# Patient Record
Sex: Female | Born: 1960 | Race: White | Hispanic: No | Marital: Married | Smoking: Never smoker
Health system: Southern US, Community
[De-identification: ages and names within clinical notes are randomized; demographics above are authoritative.]

## PROBLEM LIST (undated history)

## (undated) DIAGNOSIS — M810 Age-related osteoporosis without current pathological fracture: Secondary | ICD-10-CM

## (undated) DIAGNOSIS — R000 Tachycardia, unspecified: Secondary | ICD-10-CM

## (undated) DIAGNOSIS — N189 Chronic kidney disease, unspecified: Secondary | ICD-10-CM

## (undated) HISTORY — PX: AUGMENTATION MAMMAPLASTY: SUR837

## (undated) HISTORY — DX: Chronic kidney disease, unspecified: N18.9

## (undated) HISTORY — PX: LAPAROSCOPY: SHX197

## (undated) HISTORY — DX: Tachycardia, unspecified: R00.0

## (undated) HISTORY — DX: Age-related osteoporosis without current pathological fracture: M81.0

## (undated) HISTORY — PX: NASAL SEPTUM SURGERY: SHX37

## (undated) HISTORY — PX: LAPAROTOMY: SHX154

## (undated) HISTORY — PX: COLONOSCOPY: SHX174

## (undated) HISTORY — PX: LYMPH NODE BIOPSY: SHX201

---

## 1999-09-09 ENCOUNTER — Other Ambulatory Visit: Admission: RE | Admit: 1999-09-09 | Discharge: 1999-09-09 | Payer: Self-pay | Admitting: Obstetrics and Gynecology

## 1999-11-14 ENCOUNTER — Encounter: Payer: Self-pay | Admitting: Obstetrics and Gynecology

## 1999-11-14 ENCOUNTER — Encounter: Admission: RE | Admit: 1999-11-14 | Discharge: 1999-11-14 | Payer: Self-pay | Admitting: Obstetrics and Gynecology

## 2000-03-18 ENCOUNTER — Encounter: Payer: Self-pay | Admitting: Obstetrics and Gynecology

## 2000-03-18 ENCOUNTER — Encounter: Admission: RE | Admit: 2000-03-18 | Discharge: 2000-03-18 | Payer: Self-pay | Admitting: Obstetrics and Gynecology

## 2000-10-02 ENCOUNTER — Other Ambulatory Visit: Admission: RE | Admit: 2000-10-02 | Discharge: 2000-10-02 | Payer: Self-pay | Admitting: Obstetrics and Gynecology

## 2001-03-22 ENCOUNTER — Encounter: Payer: Self-pay | Admitting: Obstetrics and Gynecology

## 2001-03-22 ENCOUNTER — Encounter: Admission: RE | Admit: 2001-03-22 | Discharge: 2001-03-22 | Payer: Self-pay | Admitting: Obstetrics and Gynecology

## 2001-08-24 ENCOUNTER — Other Ambulatory Visit: Admission: RE | Admit: 2001-08-24 | Discharge: 2001-08-24 | Payer: Self-pay | Admitting: Obstetrics and Gynecology

## 2001-09-17 ENCOUNTER — Encounter: Payer: Self-pay | Admitting: Obstetrics and Gynecology

## 2001-09-17 ENCOUNTER — Encounter: Admission: RE | Admit: 2001-09-17 | Discharge: 2001-09-17 | Payer: Self-pay | Admitting: Obstetrics and Gynecology

## 2002-03-22 ENCOUNTER — Encounter: Payer: Self-pay | Admitting: Obstetrics and Gynecology

## 2002-03-22 ENCOUNTER — Encounter: Admission: RE | Admit: 2002-03-22 | Discharge: 2002-03-22 | Payer: Self-pay | Admitting: Obstetrics and Gynecology

## 2002-08-25 ENCOUNTER — Other Ambulatory Visit: Admission: RE | Admit: 2002-08-25 | Discharge: 2002-08-25 | Payer: Self-pay | Admitting: Obstetrics and Gynecology

## 2002-09-26 ENCOUNTER — Encounter: Payer: Self-pay | Admitting: Obstetrics and Gynecology

## 2002-09-26 ENCOUNTER — Encounter: Admission: RE | Admit: 2002-09-26 | Discharge: 2002-09-26 | Payer: Self-pay | Admitting: Obstetrics and Gynecology

## 2003-09-05 ENCOUNTER — Other Ambulatory Visit: Admission: RE | Admit: 2003-09-05 | Discharge: 2003-09-05 | Payer: Self-pay | Admitting: Obstetrics and Gynecology

## 2003-09-21 ENCOUNTER — Encounter: Admission: RE | Admit: 2003-09-21 | Discharge: 2003-09-21 | Payer: Self-pay | Admitting: Obstetrics and Gynecology

## 2003-12-13 ENCOUNTER — Encounter (INDEPENDENT_AMBULATORY_CARE_PROVIDER_SITE_OTHER): Payer: Self-pay | Admitting: Cardiology

## 2003-12-13 ENCOUNTER — Ambulatory Visit (HOSPITAL_COMMUNITY): Admission: RE | Admit: 2003-12-13 | Discharge: 2003-12-13 | Payer: Self-pay | Admitting: Cardiology

## 2004-09-30 ENCOUNTER — Encounter: Admission: RE | Admit: 2004-09-30 | Discharge: 2004-09-30 | Payer: Self-pay | Admitting: Obstetrics and Gynecology

## 2004-10-08 ENCOUNTER — Other Ambulatory Visit: Admission: RE | Admit: 2004-10-08 | Discharge: 2004-10-08 | Payer: Self-pay | Admitting: Obstetrics and Gynecology

## 2004-12-05 ENCOUNTER — Encounter: Admission: RE | Admit: 2004-12-05 | Discharge: 2004-12-05 | Payer: Self-pay | Admitting: Obstetrics and Gynecology

## 2005-10-16 ENCOUNTER — Other Ambulatory Visit: Admission: RE | Admit: 2005-10-16 | Discharge: 2005-10-16 | Payer: Self-pay | Admitting: Obstetrics and Gynecology

## 2005-10-17 ENCOUNTER — Encounter: Admission: RE | Admit: 2005-10-17 | Discharge: 2005-10-17 | Payer: Self-pay | Admitting: Obstetrics and Gynecology

## 2006-10-19 ENCOUNTER — Encounter: Admission: RE | Admit: 2006-10-19 | Discharge: 2006-10-19 | Payer: Self-pay | Admitting: Obstetrics and Gynecology

## 2006-10-20 ENCOUNTER — Other Ambulatory Visit: Admission: RE | Admit: 2006-10-20 | Discharge: 2006-10-20 | Payer: Self-pay | Admitting: Obstetrics and Gynecology

## 2007-10-18 ENCOUNTER — Ambulatory Visit: Payer: Self-pay | Admitting: Internal Medicine

## 2007-10-25 ENCOUNTER — Other Ambulatory Visit: Admission: RE | Admit: 2007-10-25 | Discharge: 2007-10-25 | Payer: Self-pay | Admitting: Obstetrics and Gynecology

## 2007-11-01 ENCOUNTER — Encounter: Admission: RE | Admit: 2007-11-01 | Discharge: 2007-11-01 | Payer: Self-pay | Admitting: Obstetrics and Gynecology

## 2007-11-18 ENCOUNTER — Ambulatory Visit: Payer: Self-pay | Admitting: Internal Medicine

## 2007-11-29 ENCOUNTER — Encounter: Admission: RE | Admit: 2007-11-29 | Discharge: 2007-11-29 | Payer: Self-pay | Admitting: Obstetrics and Gynecology

## 2008-10-13 HISTORY — PX: BREAST ENHANCEMENT SURGERY: SHX7

## 2008-10-30 ENCOUNTER — Other Ambulatory Visit: Admission: RE | Admit: 2008-10-30 | Discharge: 2008-10-30 | Payer: Self-pay | Admitting: Obstetrics and Gynecology

## 2008-11-06 ENCOUNTER — Encounter: Admission: RE | Admit: 2008-11-06 | Discharge: 2008-11-06 | Payer: Self-pay | Admitting: Obstetrics and Gynecology

## 2009-10-13 HISTORY — PX: BLEPHAROPLASTY: SUR158

## 2009-11-05 ENCOUNTER — Other Ambulatory Visit: Admission: RE | Admit: 2009-11-05 | Discharge: 2009-11-05 | Payer: Self-pay | Admitting: Obstetrics and Gynecology

## 2009-12-11 ENCOUNTER — Encounter: Admission: RE | Admit: 2009-12-11 | Discharge: 2009-12-11 | Payer: Self-pay | Admitting: Obstetrics and Gynecology

## 2010-11-06 ENCOUNTER — Other Ambulatory Visit: Payer: Self-pay | Admitting: Obstetrics & Gynecology

## 2010-11-06 DIAGNOSIS — Z1239 Encounter for other screening for malignant neoplasm of breast: Secondary | ICD-10-CM

## 2010-11-19 ENCOUNTER — Other Ambulatory Visit: Payer: Self-pay | Admitting: Obstetrics & Gynecology

## 2010-11-19 ENCOUNTER — Encounter: Payer: 59 | Admitting: Obstetrics & Gynecology

## 2010-11-19 DIAGNOSIS — Z1272 Encounter for screening for malignant neoplasm of vagina: Secondary | ICD-10-CM

## 2010-11-19 DIAGNOSIS — Z01419 Encounter for gynecological examination (general) (routine) without abnormal findings: Secondary | ICD-10-CM

## 2010-12-17 ENCOUNTER — Ambulatory Visit
Admission: RE | Admit: 2010-12-17 | Discharge: 2010-12-17 | Disposition: A | Discharge status: Home / Self Care | Payer: 59 | Source: Ambulatory Visit | Attending: Obstetrics & Gynecology | Admitting: Obstetrics & Gynecology

## 2010-12-17 DIAGNOSIS — Z1239 Encounter for other screening for malignant neoplasm of breast: Secondary | ICD-10-CM

## 2010-12-24 ENCOUNTER — Other Ambulatory Visit: Payer: Self-pay | Admitting: Obstetrics & Gynecology

## 2010-12-24 DIAGNOSIS — R928 Other abnormal and inconclusive findings on diagnostic imaging of breast: Secondary | ICD-10-CM

## 2010-12-30 ENCOUNTER — Ambulatory Visit
Admission: RE | Admit: 2010-12-30 | Discharge: 2010-12-30 | Disposition: A | Discharge status: Home / Self Care | Payer: 59 | Source: Ambulatory Visit | Attending: Obstetrics & Gynecology | Admitting: Obstetrics & Gynecology

## 2010-12-30 DIAGNOSIS — R928 Other abnormal and inconclusive findings on diagnostic imaging of breast: Secondary | ICD-10-CM

## 2011-01-03 NOTE — Assessment & Plan Note (Signed)
NAMEAMABEL, STMARIE             ACCOUNT NO.:  [id number]  MEDICAL RECORD NO.:  [id number]          PATIENT TYPE:  LOCATION:  [facility] [place]           FACILITY:  PHYSICIAN:  Allie Bossier, MD             DATE OF BIRTH:  DATE OF SERVICE:  11/19/2010                                 CLINIC NOTE  Ms. Wirtz is a 50 year old married white, G2, P1, A1.  She has an 36- year-old daughter who is a patient here ([place]).  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 She has discovered a perineal "mass" that has been there since Christmas and she wants that evaluated.  She is also due for her Pap smear.  PAST MEDICAL HISTORY:  She had a history of primary infertility and was treated with drugs, but actually she had the pregnancy on her own.  She was told she had endometriosis in the past.  She had a history of osteopenia, but her most recent DEXA scan that is normal and she has sinus tachycardia.  MEDICATIONS:  Calcium, vitamin D, multivitamin, B12, Toprol XL 25 mg daily, Loestrin 124 and fish oil b.i.d.  PAST SURGICAL HISTORY:  She had a right ruptured ectopic that required a laparotomy, wisdom teeth extraction, laparoscopy and laparotomy x2, saline breast implants in 2009.  ALLERGIES:  No latex allergies.  Drug allergy is CODEINE.  REVIEW OF SYSTEMS:  She has been married for 26 years.  She denies dyspareunia.  Pap smear is due.  Her mammogram is scheduled for December 12, 2010.  She has had 2 colonoscopies and the last was done in 2009.  Her last bone density was 2 years ago.  Please note, on a colonoscopy they did find some polyps the first time, but not the second time.  FAMILY HISTORY:  Negative for breast, GYN and colon cancers, but positive for heart disease in her father.  PHYSICAL EXAMINATION:  GENERAL:  Well-nourished, well-hydrated pleasant white female, height 5 feet 8-3/4, weight 132 pounds, blood pressure 128/79, pulse  90. HEENT:  Normal. HEART:  Regular rate and rhythm. LUNGS:  Clear to auscultation bilaterally. BREASTS:  Normal after saline implants.  No nipple discharge, skin changes or appreciable masses. ABDOMEN:  Scaphoid.  No palpable hepatosplenomegaly. EXTERNAL GENITALIA:  No lesions.  On her left buttock towards the perineum, she has a boil with about a 2-cm tracking.  When I pressed on it, it spilled out a large amount of material and immediately shrunken size.  Her vulva shows no lesions.  Her vagina appears normal.  Cervix appears normal.  Uterus is normal size and shape, anteverted mobile. Adnexa nontender and no masses.  ASSESSMENT AND PLAN:  Annual exam.  I have checked a Pap smear and recommended self-breast and self-vulvar exams with regard to her boil on her bottom.  I have told her that if this does not resolve entirely to come back.  With regard to her birth control, I have changed her to low Loestrin one a day and I have given her samples and prescription.     Allie Bossier, MD  MCD/MEDQ  D:  11/19/2010  T:  11/20/2010  Job:  [id number]

## 2011-02-25 NOTE — Assessment & Plan Note (Signed)
[facility]                         GASTROENTEROLOGY OFFICE NOTE   LAGUANA, DESAUTEL                    MRN:          [id number]  DATE:10/18/2007                            DOB:          [birth year]    The patient is self-referred.   REASON FOR CONSULTATION:  Surveillance colonoscopy.   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  The  patient initially underwent diagnostic colonoscopy in December 2003 with  Dr. Sharrell Ku to evaluate asymptomatic Hemoccult-positive stool.  Complete colonoscopy to the cecum revealed a 2-mm rectal polyp which was  removed and found to be a tubular adenoma.  Also present were internal  and external hemorrhoids, no other abnormalities.  Followup in 5 years  recommended.  The patient has had no interval medical problems or active  GI symptoms.  She denies abdominal pain, bleeding or change in bowel  habits.   PAST MEDICAL HISTORY:  1. Osteoporosis.  2. Cardiac arrhythmia.  3. Status post tubal ligation.  4. Repair of deviated septum.   ALLERGIES:  NO KNOWN DRUG ALLERGIES.      The patient is INTOLERANT TO  CODEINE, which causes nausea.   CURRENT MEDICATIONS:  1. Calcium with vitamin D.  2. Multivitamin.  3. Toprol-XL 50 mg daily.  4. Loestrin 1.5/30 mg daily.  5. Fosamax 70 mg weekly.  6. Advil p.r.n.   FAMILY HISTORY:  No family history of gastrointestinal malignancy.  Father with a history of coronary artery disease.   SOCIAL HISTORY:  The patient is married with 1 daughter.  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  She does not smoke and occasionally  drinks wine.   REVIEW OF SYSTEMS:  Per diagnostic evaluation center.   PHYSICAL EXAMINATION:  Well-appearing female in no acute distress.  Her blood pressure is 90/56, heart rate is  76 and regular.  Weight is  131 pounds.  She is 5 feet 8-1/2 inches in height.  HEENT:  Sclerae are anicteric.  Conjunctivae are pink.  Oral mucosa is  intact.  No adenopathy.  LUNGS:  Clear.  HEART:  Regular.  ABDOMEN:  Soft without tenderness, mass or hernia.   IMPRESSION:  This is a 50 year old female with a history of a diminutive  adenomatous colon polyp.  She presents today for routine surveillance  colonoscopy.  She is an appropriate candidate without contraindication.  The followup interval is appropriate.  We discussed the nature of  colonoscopy in detail.  She understood the risks, benefits and  alternatives and was interested in proceeding.     Wilhemina Bonito. Marina Goodell, MD  Electronically Signed    JNP/MedQ  DD: 10/18/2007  DT: 10/19/2007  Job #: [id number]   cc:   Artist Pais, M.D.

## 2011-04-01 ENCOUNTER — Encounter: Payer: Self-pay | Admitting: Family Medicine

## 2011-04-01 ENCOUNTER — Inpatient Hospital Stay (INDEPENDENT_AMBULATORY_CARE_PROVIDER_SITE_OTHER)
Admission: RE | Admit: 2011-04-01 | Discharge: 2011-04-01 | Disposition: A | Discharge status: Home / Self Care | Payer: 59 | Source: Ambulatory Visit | Attending: Family Medicine | Admitting: Family Medicine

## 2011-04-01 DIAGNOSIS — J069 Acute upper respiratory infection, unspecified: Secondary | ICD-10-CM

## 2011-05-26 ENCOUNTER — Other Ambulatory Visit: Payer: Self-pay | Admitting: Obstetrics & Gynecology

## 2011-05-26 DIAGNOSIS — R921 Mammographic calcification found on diagnostic imaging of breast: Secondary | ICD-10-CM

## 2011-06-30 ENCOUNTER — Ambulatory Visit
Admission: RE | Admit: 2011-06-30 | Discharge: 2011-06-30 | Disposition: A | Discharge status: Home / Self Care | Payer: 59 | Source: Ambulatory Visit | Attending: Obstetrics & Gynecology | Admitting: Obstetrics & Gynecology

## 2011-06-30 DIAGNOSIS — R921 Mammographic calcification found on diagnostic imaging of breast: Secondary | ICD-10-CM

## 2011-09-15 NOTE — Progress Notes (Signed)
Summary: COUGH/FEVER,CHILLS Room 3   Vital Signs:  Patient Profile:   50 Years Old Female CC:      Chills, Fever 100.8, Productive cough x 4 days Height:     68.5 inches Weight:      130 pounds O2 Sat:      100 % O2 treatment:    Room Air Temp:     99.2 degrees F oral Pulse rate:   100 / minute Pulse rhythm:   regular Resp:     12 per minute BP sitting:   116 / 73  (left arm) Cuff size:   regular  Vitals Entered By: Emilio Math (April 01, 2011 2:28 PM)                  Current Allergies: ! CODEINEHistory of Present Illness Chief Complaint: Chills, Fever 100.8, Productive cough x 4 days History of Present Illness:  Subjective: Patient complains of URI symptoms that started 4 days ago with chills, myalgias, headache, fever to 100.8 No sore throat + cough for two days worse at night No pleuritic pain No wheezing No nasal congestion No post-nasal drainage No sinus pain/pressure No itchy/red eyes No earache No hemoptysis No SOB + fever/chills 100.8 No nausea No vomiting No abdominal pain No diarrhea No skin rashes + fatigue + myalgias + headache     Current Meds LOESTRIN 1/20 (21) 1-20 MG-MCG TABS (NORETHINDRONE ACET-ETHINYL EST)  TOPROL XL 50 MG XR24H-TAB (METOPROLOL SUCCINATE)  BENZONATATE 200 MG CAPS (BENZONATATE) One by mouth hs as needed cough AZITHROMYCIN 250 MG TABS (AZITHROMYCIN) Two tabs by mouth on day 1, then 1 tab daily on days 2 through 5 (Rx void after 04/09/11)  REVIEW OF SYSTEMS Constitutional Symptoms       Complains of fever and chills.     Denies night sweats, weight loss, weight gain, and fatigue.  Eyes       Denies change in vision, eye pain, eye discharge, glasses, contact lenses, and eye surgery. Ear/Nose/Throat/Mouth       Complains of hoarseness.      Denies hearing loss/aids, change in hearing, ear pain, ear discharge, dizziness, frequent runny nose, frequent nose bleeds, sinus problems, sore throat, and tooth pain or bleeding.    Respiratory       Complains of productive cough.      Denies dry cough, wheezing, shortness of breath, asthma, bronchitis, and emphysema/COPD.  Cardiovascular       Denies murmurs, chest pain, and tires easily with exhertion.    Gastrointestinal       Denies stomach pain, nausea/vomiting, diarrhea, constipation, blood in bowel movements, and indigestion. Genitourniary       Denies painful urination, kidney stones, and loss of urinary control. Neurological       Denies paralysis, seizures, and fainting/blackouts. Musculoskeletal       Denies muscle pain, joint pain, joint stiffness, decreased range of motion, redness, swelling, muscle weakness, and gout.  Skin       Denies bruising, unusual mles/lumps or sores, and hair/skin or nail changes.  Psych       Denies mood changes, temper/anger issues, anxiety/stress, speech problems, depression, and sleep problems.  Past History:  Past Medical History: Inappropriate Sinus Tachycardia  Past Surgical History: Ectopic pregnancy GYN surgery Deviated Septum repair  Family History: Mother, Tachycardia Father, Bypass surgery  Social History: Non smoker ETOH_yes No DRugs Nurse @ [employer]   Objective:  No acute distress  Eyes:  Pupils are equal, round, and reactive  to light and accomodation.  Extraocular movement is intact.  Conjunctivae are not inflamed.  Ears:  Canals normal.  Tympanic membranes normal.   Nose:  Mildly congested turbinates.  No sinus tenderness  Pharynx:  Normal  Neck:  Supple.  Slightly tender shotty posterior nodes are palpated bilaterally.  Lungs:  Clear to auscultation.  Breath sounds are equal.  Heart:  Regular rate and rhythm without murmurs, rubs, or gallops.  Abdomen:  Nontender without masses or hepatosplenomegaly.  Bowel sounds are present.  No CVA or flank tenderness.  Skin:  No rash Assessment New Problems: UPPER RESPIRATORY INFECTION, ACUTE (ICD-465.9)  NO EVIDENCE BACTERIAL INFECTION   TODAY  Plan New Medications/Changes: AZITHROMYCIN 250 MG TABS (AZITHROMYCIN) Two tabs by mouth on day 1, then 1 tab daily on days 2 through 5 (Rx void after 04/09/11)  #6 tabs x 0, 04/01/2011, Donna Christen MD BENZONATATE 200 MG CAPS (BENZONATATE) One by mouth hs as needed cough  #12 x 0, 04/01/2011, Donna Christen MD  New Orders: Pulse Oximetry (single measurment) [08657] New Patient Level III [84696] Planning Comments:   Treat symptomatically for now:  Increase fluid intake, begin expectorant , topical decongestant,  cough suppressant at bedtime.  If fever/chills/sweats persist, or if not improving 5  days begin Z-pack (given Rx to hold).  Followup with PCP if not improving 7 to 10 days.   The patient and/or caregiver has been counseled thoroughly with regard to medications prescribed including dosage, schedule, interactions, rationale for use, and possible side effects and they verbalize understanding.  Diagnoses and expected course of recovery discussed and will return if not improved as expected or if the condition worsens. Patient and/or caregiver verbalized understanding.  Prescriptions: AZITHROMYCIN 250 MG TABS (AZITHROMYCIN) Two tabs by mouth on day 1, then 1 tab daily on days 2 through 5 (Rx void after 04/09/11)  #6 tabs x 0   Entered and Authorized by:   Donna Christen MD   Signed by:   Donna Christen MD on 04/01/2011   Method used:   Print then Give to Patient   RxID:   [id number] BENZONATATE 200 MG CAPS (BENZONATATE) One by mouth hs as needed cough  #12 x 0   Entered and Authorized by:   Donna Christen MD   Signed by:   Donna Christen MD on 04/01/2011   Method used:   Print then Give to Patient   RxID:   [id number]   Patient Instructions: 1)  Take Mucinex  (guaifenesin) twice daily for congestion. 2)  Increase fluid intake, rest. 3)  If increased sinus congestion develops, may use Afrin nasal spray (or generic oxymetazoline) twice daily for about 5 days.  Also  recommend using saline nasal spray several times daily and/or saline nasal irrigation. 4)  Begin Azithromycin if not improving about one week,  or if persistent fever develops. 5)  Followup with family doctor if not improving 10 to 14 days.   Orders Added: 1)  Pulse Oximetry (single measurment) [94760] 2)  New Patient Level III [63875]

## 2011-11-25 ENCOUNTER — Other Ambulatory Visit: Payer: Self-pay | Admitting: Obstetrics & Gynecology

## 2011-11-25 DIAGNOSIS — R921 Mammographic calcification found on diagnostic imaging of breast: Secondary | ICD-10-CM

## 2011-12-02 ENCOUNTER — Encounter: Payer: Self-pay | Admitting: Obstetrics & Gynecology

## 2011-12-02 ENCOUNTER — Ambulatory Visit (INDEPENDENT_AMBULATORY_CARE_PROVIDER_SITE_OTHER): Payer: 59 | Admitting: Obstetrics & Gynecology

## 2011-12-02 VITALS — BP 109/71 | HR 74 | Temp 98.3°F | Resp 16 | Ht 68.5 in | Wt 127.0 lb

## 2011-12-02 DIAGNOSIS — Z113 Encounter for screening for infections with a predominantly sexual mode of transmission: Secondary | ICD-10-CM

## 2011-12-02 DIAGNOSIS — Z0000 Encounter for general adult medical examination without abnormal findings: Secondary | ICD-10-CM

## 2011-12-02 DIAGNOSIS — Z1272 Encounter for screening for malignant neoplasm of vagina: Secondary | ICD-10-CM

## 2011-12-02 DIAGNOSIS — Z01419 Encounter for gynecological examination (general) (routine) without abnormal findings: Secondary | ICD-10-CM

## 2011-12-02 MED ORDER — NORETHIN-ETH ESTRAD-FE BIPHAS 1 MG-10 MCG / 10 MCG PO TABS: 1.0000 | ORAL_TABLET | Freq: Every day | ORAL | Status: DC

## 2011-12-02 NOTE — Progress Notes (Signed)
Subjective:    Natalie David is a 51 y.o. female who presents for an annual exam. The patient has no complaints today.She wants a refill on her Lo Lo estrin. The patient is sexually active. GYN screening history: last pap: was normal. The patient wears seatbelts: yes. The patient participates in regular exercise: yes. Has the patient ever been transfused or tattooed?: not asked. The patient reports that there is not domestic violence in her life.   Menstrual History: OB History    Grav Para Term Preterm Abortions TAB SAB Ect Mult Living   2 1 1  1   1  1       Menarche age: 17 No LMP recorded. Patient is not currently having periods (Reason: Perimenopausal).    The following portions of the patient's history were reviewed and updated as appropriate: allergies, current medications, past family history, past medical history, past social history, past surgical history and problem list.  Review of Systems Pertinent items are noted in HPI. She is a Charity fundraiser at [employer]. She has had her flu shot this year. Her mammogram is scheduled for March 2013. She has been married for 28 years and denies dysparunia.   Objective:    BP 109/71  Pulse 74  Temp(Src) 98.3 F (36.8 C) (Oral)  Resp 16  Ht 5' 8.5" (1.74 m)  Wt 127 lb (57.607 kg)  BMI 19.03 kg/m2  General Appearance:    Alert, cooperative, no distress, appears stated age  Head:    Normocephalic, without obvious abnormality, atraumatic  Eyes:    PERRL, conjunctiva/corneas clear, EOM's intact, fundi    benign, both eyes  Ears:    Normal TM's and external ear canals, both ears  Nose:   Nares normal, septum midline, mucosa normal, no drainage    or sinus tenderness  Throat:   Lips, mucosa, and tongue normal; teeth and gums normal  Neck:   Supple, symmetrical, trachea midline, no adenopathy;    thyroid:  no enlargement/tenderness/nodules; no carotid   bruit or JVD  Back:     Symmetric, no curvature, ROM normal, no CVA tenderness  Lungs:      Clear to auscultation bilaterally, respirations unlabored  Chest Wall:    No tenderness or deformity   Heart:    Regular rate and rhythm, S1 and S2 normal, no murmur, rub   or gallop  Breast Exam:    No tenderness, masses, or nipple abnormality  Abdomen:     Soft, non-tender, bowel sounds active all four quadrants,    no masses, no organomegaly  Genitalia:    Normal female without lesion, discharge or tenderness, NSSA, NT, mobile, no adnexal masses or tenderness     Extremities:   Extremities normal, atraumatic, no cyanosis or edema  Pulses:   2+ and symmetric all extremities  Skin:   Skin color, texture, turgor normal, no rashes or lesions  Lymph nodes:   Cervical, supraclavicular, and axillary nodes normal  Neurologic:   CNII-XII intact, normal strength, sensation and reflexes    throughout  .    Assessment:    Healthy female exam.    Plan:     Mammogram. Pap smear.

## 2011-12-11 ENCOUNTER — Ambulatory Visit (INDEPENDENT_AMBULATORY_CARE_PROVIDER_SITE_OTHER): Payer: 59 | Admitting: Family Medicine

## 2011-12-11 ENCOUNTER — Encounter: Payer: Self-pay | Admitting: Family Medicine

## 2011-12-11 DIAGNOSIS — R739 Hyperglycemia, unspecified: Secondary | ICD-10-CM

## 2011-12-11 DIAGNOSIS — R000 Tachycardia, unspecified: Secondary | ICD-10-CM

## 2011-12-11 DIAGNOSIS — R7309 Other abnormal glucose: Secondary | ICD-10-CM

## 2011-12-11 DIAGNOSIS — R718 Other abnormality of red blood cells: Secondary | ICD-10-CM

## 2011-12-11 LAB — LIPID PANEL
Cholesterol: 173 mg/dL (ref 0–200)
LDL Cholesterol: 80 mg/dL (ref 0–99)
Total CHOL/HDL Ratio: 2.4 Ratio
VLDL: 20 mg/dL (ref 0–40)

## 2011-12-11 LAB — COMPLETE METABOLIC PANEL WITH GFR
ALT: 9 U/L (ref 0–35)
AST: 21 U/L (ref 0–37)
Albumin: 4.5 g/dL (ref 3.5–5.2)
Alkaline Phosphatase: 53 U/L (ref 39–117)
BUN: 16 mg/dL (ref 6–23)
Calcium: 9.5 mg/dL (ref 8.4–10.5)
Potassium: 4.4 mEq/L (ref 3.5–5.3)
Sodium: 139 mEq/L (ref 135–145)
Total Bilirubin: 0.7 mg/dL (ref 0.3–1.2)

## 2011-12-11 LAB — TSH: TSH: 0.948 u[IU]/mL (ref 0.350–4.500)

## 2011-12-11 LAB — CBC WITH DIFFERENTIAL/PLATELET
Basophils Relative: 0 % (ref 0–1)
Eosinophils Absolute: 0.1 10*3/uL (ref 0.0–0.7)
MCV: 103 fL — ABNORMAL HIGH (ref 78.0–100.0)
Monocytes Absolute: 0.3 10*3/uL (ref 0.1–1.0)
Neutro Abs: 3.1 10*3/uL (ref 1.7–7.7)
Neutrophils Relative %: 55 % (ref 43–77)
Platelets: 190 10*3/uL (ref 150–400)
RDW: 12.2 % (ref 11.5–15.5)
WBC: 5.6 10*3/uL (ref 4.0–10.5)

## 2011-12-11 NOTE — Progress Notes (Signed)
  Subjective:    Patient ID: Natalie David, female    DOB: 31-May-1961, 51 y.o.   MRN: [id number]  HPI Patient is here to get established. She reports getting her yearly Pap smear exam and management of her menopausal symptoms mammogram through her GYN. She has had a history of inappropriate tachycardia that she takes Toprol XL 50 half a tablet a day managed by her cardiologist She takes B12 pills because of a elevated MCV on her CBC She had her colonoscopy about 4 years ago to recheck polyps found on an earlier colonoscopy. She is supposed to schedule a repeat colonoscopy in about one year   immunization reviewed and updated She does need to have routine labs done and she has had some episodes of mildly elevated blood sugars before.   Review of Systems  All other systems reviewed and are negative.      BP 92/62  Pulse 76  Ht 5\' 8"  (1.727 m)  Wt 128 lb (58.06 kg)  BMI 19.46 kg/m2 Objective:   Physical Exam  Constitutional: She is oriented to person, place, and time. She appears well-developed and well-nourished.  HENT:  Head: Normocephalic.  Neck: Neck supple.  Cardiovascular: Normal rate, regular rhythm and normal heart sounds.   Pulmonary/Chest: Effort normal and breath sounds normal.  Neurological: She is alert and oriented to person, place, and time.  Skin: Skin is warm and dry.  Psychiatric: She has a normal mood and affect.          Assessment & Plan:  Patient immunizations updated she doesn't need a new immunizations now Discussed about the B12 pills. Explained that oral B12 unless there is extremely high dosage of it is not absorbed. With her MCV remained elevated we can do one of 3 things. #1 ignore the MCV as long as the H&H remained stable #2 Place her on a much higher B12 or initiate injection or nasal B12 #3 have her keep taking the pills realizes more placebo than therapeutic We'll obtain routine lab work Return in one year for followup.

## 2011-12-11 NOTE — Patient Instructions (Signed)
Vitamin B12 and Folate Test This is a test used to help diagnose the cause of anemia or neuropathy (nerve damage), to evaluate nutritional status in some patients, or to monitor effectiveness of treatment for B12 or folate deficiency. These tests measure the concentration of folate and vitamin B12 in the serum (liquid portion of the blood). The amount of folate inside the red blood cell (RBC) may also be measured ; it will normally be at a higher concentration inside the cell than in the serum.  B12 and folate are both part of the B complex of vitamins and come from food. Folate is found in leafy green vegetables, citrus fruits, dry beans and peas, liver, and yeast; while B12 is found in animal products such as red meat, fish, poultry, milk, and eggs. Fortified cereals, breads, and other grain products are now also important dietary sources of both B12 and folate (identified as "folic acid" on nutritional labels), especially for those vegetarians who do not consume any animal products.  Both B12 and folate are necessary for normal RBC formation, tissue and cellular repair, and DNA synthesis. B12 is also important for nerve health, while folate is necessary for cell division such as is seen in a fetus during pregnancy. A deficiency in either B12 or folate can lead to a form of anemia characterized by the production of fewer, but larger, RBC's (called macrocytes). A deficiency in B12 can also result in varying degrees of neuropathy, nerve damage that can cause tingling and numbness in the patient's hands and feet. A deficiency in folate can cause neural tube defects such as spina bifida in a growing fetus.  PREPARATION FOR TEST No fasting is necessary. A blood sample is obtained by inserting a needle into a vein in the arm. NORMAL FINDINGS 160-950 pg/mL or 118-701 pmol/L (SI units) Ranges for normal findings may vary among different laboratories and hospitals. You should always check with your doctor after  having lab work or other tests done to discuss the meaning of your test results and whether your values are considered within normal limits. MEANING OF TEST  Your caregiver will go over the test results with you and discuss the importance and meaning of your results, as well as treatment options and the need for additional tests if necessary. Reference values are dependent on many factors, including patient age, gender, sample population, and testing method. Numeric test results have different meanings in different labs. Your lab report should include the specific reference range for your test.  If there is a decreased concentration of B12 and/or folate, then it is likely there is some degree of deficiency. The test results will indicate the presence of the deficiency, but they do not necessarily reflect the severity of the anemia or neuropathy associated with the deficiency or its underlying cause. There are a variety of causes of B12 and/or folate deficiencies. Most of these are from poor intake, not absorbing the vitamins or losing more vitamins than are taken in. OBTAINING THE TEST RESULTS It is your responsibility to obtain your test results. Ask the lab or department performing the test when and how you will get your results. Document Released: 10/24/2004 Document Revised: 06/11/2011 Document Reviewed: 10/01/2008 [facility] Patient Information 2012 [place], [place].

## 2011-12-19 ENCOUNTER — Ambulatory Visit
Admission: RE | Admit: 2011-12-19 | Discharge: 2011-12-19 | Disposition: A | Discharge status: Home / Self Care | Payer: 59 | Source: Ambulatory Visit | Attending: Obstetrics & Gynecology | Admitting: Obstetrics & Gynecology

## 2011-12-19 DIAGNOSIS — R921 Mammographic calcification found on diagnostic imaging of breast: Secondary | ICD-10-CM

## 2012-04-06 ENCOUNTER — Ambulatory Visit (INDEPENDENT_AMBULATORY_CARE_PROVIDER_SITE_OTHER): Payer: 59 | Admitting: Family Medicine

## 2012-04-06 ENCOUNTER — Encounter: Payer: Self-pay | Admitting: Family Medicine

## 2012-04-06 VITALS — BP 113/73 | HR 91 | Ht 68.5 in | Wt 130.0 lb

## 2012-04-06 DIAGNOSIS — M545 Low back pain, unspecified: Secondary | ICD-10-CM

## 2012-04-06 MED ORDER — CYCLOBENZAPRINE HCL 10 MG PO TABS: 10.0000 mg | ORAL_TABLET | Freq: Three times a day (TID) | ORAL | Status: DC | PRN

## 2012-04-06 MED ORDER — DICLOFENAC SODIUM 75 MG PO TBEC: 75.0000 mg | DELAYED_RELEASE_TABLET | Freq: Two times a day (BID) | ORAL | Status: DC

## 2012-04-06 NOTE — Progress Notes (Signed)
  Patient Name: Natalie David Date of Birth: 04-Aug-1961 Medical Record Number: [id number] Gender: female  PCP: METHENEY,CATHERINE, MD  History of Present Illness:  Natalie David is a 51 y.o. 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 The back pain is localized into the lumbar spine area. They also describe no radiculopathy.  No numbness or tingling. No bowel or bladder incontinence. No focal weakness. Prior interventions: none Physical therapy: No Chiropractic manipulations: No Acupuncture: No Osteopathic manipulation: No  Past Medical History, Surgical History, Family History, Medications, Allergies have been reviewed and updated if relevant.  Review of Systems  GEN: No fevers, chills. Nontoxic. Primarily MSK c/o today. MSK: Detailed in the HPI GI: tolerating PO intake without difficulty Neuro: As above  Otherwise the pertinent positives of the ROS are noted above.    Physical Exam  Filed Vitals:   04/06/12 1546  BP: 113/73  Pulse: 91  Height: 5' 8.5" (1.74 m)  Weight: 130 lb (58.968 kg)    Gen: Well-developed,well-nourished,in no acute distress; alert,appropriate and cooperative throughout examination HEENT: Normocephalic and atraumatic without obvious abnormalities.  Ears, externally no deformities Pulm: Breathing comfortably in no respiratory distress Range of motion at  the waist: Flexion, rotation and lateral bending: full  No echymosis or edema Rises to examination table with no difficulty Gait: minimally antalgic  Inspection/Deformity: No abnormality Paraspinus T:  Mild L5  B Ankle Dorsiflexion (L5,4): 5/5 B Great Toe Dorsiflexion (L5,4): 5/5 Heel Walk (L5): WNL Toe Walk (S1): WNL Rise/Squat (L4): WNL  SENSORY B Medial Foot (L4): WNL B Dorsum (L5): WNL B  Lateral (S1): WNL Light Touch: WNL Pinprick: WNL  REFLEXES Knee (L4): 2+ Ankle (S1): 2+  B SLR, seated: neg B SLR, supine: neg B FABER: neg B Reverse FABER: neg B Greater Troch: NT B Log Roll: neg B Stork: NT B Sciatic Notch: NT   Assessment and Plan:  Back pain  I reviewed with the patient the structures involved and how they related to diagnosis.   Conservative algorithms for acute back pain generally begin with the following: NSAIDS Muscle Relaxants Mild pain medication  If not progressing, these modalities can be helpful in select cases. Chiropractic or Osteopathic Manipulation -- [place] HEP reviewed with her  Start with medications, core rehab, and progress from there following low back pain algorithm. No red flags are present.  She will call if not improving in a few weeks, and we can make PT referral

## 2012-07-19 ENCOUNTER — Telehealth: Payer: Self-pay | Admitting: *Deleted

## 2012-07-19 DIAGNOSIS — R7989 Other specified abnormal findings of blood chemistry: Secondary | ICD-10-CM

## 2012-07-19 NOTE — Telephone Encounter (Signed)
Lab entered

## 2012-07-20 LAB — BASIC METABOLIC PANEL
Calcium: 9.3 mg/dL (ref 8.4–10.5)
Creat: 1.01 mg/dL (ref 0.50–1.10)
Glucose, Bld: 85 mg/dL (ref 70–99)
Potassium: 4.2 mEq/L (ref 3.5–5.3)
Sodium: 138 mEq/L (ref 135–145)

## 2012-07-21 ENCOUNTER — Telehealth: Payer: Self-pay | Admitting: Family Medicine

## 2012-11-09 ENCOUNTER — Encounter: Payer: 59 | Admitting: Family Medicine

## 2012-11-15 ENCOUNTER — Other Ambulatory Visit: Payer: Self-pay | Admitting: Obstetrics & Gynecology

## 2012-11-15 DIAGNOSIS — R921 Mammographic calcification found on diagnostic imaging of breast: Secondary | ICD-10-CM

## 2012-12-10 ENCOUNTER — Encounter: Payer: Self-pay | Admitting: Family Medicine

## 2012-12-10 ENCOUNTER — Ambulatory Visit (INDEPENDENT_AMBULATORY_CARE_PROVIDER_SITE_OTHER): Payer: 59 | Admitting: Family Medicine

## 2012-12-10 VITALS — BP 94/60 | HR 83 | Ht 68.5 in | Wt 124.0 lb

## 2012-12-10 DIAGNOSIS — M545 Low back pain, unspecified: Secondary | ICD-10-CM

## 2012-12-10 DIAGNOSIS — Z0000 Encounter for general adult medical examination without abnormal findings: Secondary | ICD-10-CM

## 2012-12-10 DIAGNOSIS — R000 Tachycardia, unspecified: Secondary | ICD-10-CM

## 2012-12-10 LAB — COMPLETE METABOLIC PANEL WITH GFR
ALT: 13 U/L (ref 0–35)
Alkaline Phosphatase: 42 U/L (ref 39–117)
CO2: 26 mEq/L (ref 19–32)
Calcium: 9.6 mg/dL (ref 8.4–10.5)
GFR, Est African American: 63 mL/min
GFR, Est Non African American: 54 mL/min — ABNORMAL LOW
Sodium: 141 mEq/L (ref 135–145)
Total Bilirubin: 0.8 mg/dL (ref 0.3–1.2)
Total Protein: 7.5 g/dL (ref 6.0–8.3)

## 2012-12-10 LAB — LIPID PANEL
Cholesterol: 157 mg/dL (ref 0–200)
VLDL: 20 mg/dL (ref 0–40)

## 2012-12-10 MED ORDER — METOPROLOL SUCCINATE ER 50 MG PO TB24: 50.0000 mg | ORAL_TABLET | Freq: Every day | ORAL | Status: DC

## 2012-12-10 MED ORDER — METOPROLOL SUCCINATE ER 50 MG PO TB24: ORAL_TABLET | ORAL | Status: DC

## 2012-12-10 MED ORDER — DICLOFENAC SODIUM 75 MG PO TBEC: 75.0000 mg | DELAYED_RELEASE_TABLET | Freq: Two times a day (BID) | ORAL | Status: DC

## 2012-12-10 MED ORDER — DICLOFENAC SODIUM 75 MG PO TBEC: 75.0000 mg | DELAYED_RELEASE_TABLET | Freq: Two times a day (BID) | ORAL | Status: DC | PRN

## 2012-12-10 NOTE — Progress Notes (Signed)
Subjective:     Natalie David is a 52 y.o. female and is here for a comprehensive physical exam. The patient reports no problems. She has been seeing cardiology for the last several years for intermittent tachycardia. She uses metoprolol. As the time she takes a half a tab but sometimes does take a whole tablet she's feeling symptomatic. Typically she sees him once a year they do an EKG and then refill her medications. Because the co-pays her to go up very high for specialty care she would like to know if I would be willing to take her for that prescription into her yearly EKG. I think that certainly very reasonable.  History   Social History  . Marital Status: Married    Spouse Name: Analiyah Lechuga    Number of Children: 1   . Years of Education: N/A   Occupational History  . 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 Smoking status: Never Smoker   . Smokeless tobacco: Never Used  . Alcohol Use: 1.8 oz/week    3 Glasses of wine per week  . Drug Use: No  . Sexually Active: Yes -- Female partner(s)   Other Topics Concern  . Not on file   Social History Narrative   Some exercise.    Health Maintenance  Topic Date Due  . Influenza Vaccine  06/13/2013  . Mammogram  12/18/2013  . Pap Smear  12/01/2014  . Colonoscopy  12/11/2017  . Tetanus/tdap  12/10/2021    The following portions of the patient's history were reviewed and updated as appropriate: allergies, current medications, past family history, past medical history, past social history, past surgical history and problem list.  Review of Systems A comprehensive review of systems was negative.   Objective:    BP 94/60  Pulse 83  Ht 5' 8.5" (1.74 m)  Wt 124 lb (56.246 kg)  BMI 18.58 kg/m2 General appearance: alert, cooperative and appears stated age Head: Normocephalic, without obvious abnormality, atraumatic Eyes:  conj clear, EOMI, PEERLA Ears: normal TM's and external ear canals both  ears Nose: Nares normal. Septum midline. Mucosa normal. No drainage or sinus tenderness. Throat: lips, mucosa, and tongue normal; teeth and gums normal Neck: no adenopathy, no carotid bruit, no JVD, supple, symmetrical, trachea midline and thyroid not enlarged, symmetric, no tenderness/mass/nodules Back: symmetric, no curvature. ROM normal. No CVA tenderness. Lungs: clear to auscultation bilaterally Heart: regular rate and rhythm, S1, S2 normal, no murmur, click, rub or gallop Abdomen: soft, non-tender; bowel sounds normal; no masses,  no organomegaly Extremities: extremities normal, atraumatic, no cyanosis or edema Pulses: 2+ and symmetric Skin: Skin color, texture, turgor normal. No rashes or lesions Lymph nodes: Cervical adenopathy: normal and Supraclavicular adenopathy: normal Neurologic: Alert and oriented X 3, normal strength and tone. Normal symmetric reflexes. Normal coordination and gait    Assessment:    Healthy female exam.      Plan:     See After Visit Summary for Counseling Recommendations  Keep up a regular exercise program and make sure you are eating a healthy diet Try to eat 4 servings of dairy a day, or if you are lactose intolerant take a calcium with vitamin D daily.  Your vaccines are up to date.   Intermittent tachycardia-EKG shows rate of 59, NSR, no acute changes.  Refill her metoprolol x 1 year.  F/u if any changes in her sxs. .  She also has some intermittent low  back pain with like a refill on her Voltaren. She does not take it daily and only uses it when necessary. Reminded her of the risk of renal injury as well as GI ulcers with excessive and frequent use.  She has a followup in 2 days for her pelvic exam and breast exam with Dr. Nicholaus Bloom.    Given a handout on shingles vaccine. Now prefer 50 and above. She could certainly check with your insurance to see if it is covered and we are happy to give it to her when she would like.

## 2012-12-10 NOTE — Patient Instructions (Addendum)
Keep up a regular exercise program and make sure you are eating a healthy diet Try to eat 4 servings of dairy a day, or if you are lactose intolerant take a calcium with vitamin D daily.  Your vaccines are up to date.   

## 2012-12-12 ENCOUNTER — Encounter: Payer: Self-pay | Admitting: Family Medicine

## 2012-12-12 DIAGNOSIS — N1831 Chronic kidney disease, stage 3a: Secondary | ICD-10-CM | POA: Insufficient documentation

## 2012-12-12 DIAGNOSIS — N183 Chronic kidney disease, stage 3 (moderate): Secondary | ICD-10-CM

## 2012-12-14 ENCOUNTER — Encounter: Payer: Self-pay | Admitting: *Deleted

## 2012-12-15 ENCOUNTER — Encounter: Payer: Self-pay | Admitting: Internal Medicine

## 2012-12-16 ENCOUNTER — Ambulatory Visit (INDEPENDENT_AMBULATORY_CARE_PROVIDER_SITE_OTHER): Payer: 59 | Admitting: Obstetrics & Gynecology

## 2012-12-16 ENCOUNTER — Encounter: Payer: Self-pay | Admitting: Obstetrics & Gynecology

## 2012-12-16 VITALS — BP 93/61 | HR 96 | Resp 14 | Ht 68.5 in | Wt 126.0 lb

## 2012-12-16 DIAGNOSIS — Z0000 Encounter for general adult medical examination without abnormal findings: Secondary | ICD-10-CM

## 2012-12-16 DIAGNOSIS — Z124 Encounter for screening for malignant neoplasm of cervix: Secondary | ICD-10-CM

## 2012-12-16 DIAGNOSIS — Z1151 Encounter for screening for human papillomavirus (HPV): Secondary | ICD-10-CM

## 2012-12-16 DIAGNOSIS — Z01419 Encounter for gynecological examination (general) (routine) without abnormal findings: Secondary | ICD-10-CM

## 2012-12-16 DIAGNOSIS — Z3041 Encounter for surveillance of contraceptive pills: Secondary | ICD-10-CM

## 2012-12-16 MED ORDER — NORETHIN-ETH ESTRAD-FE BIPHAS 1 MG-10 MCG / 10 MCG PO TABS: 1.0000 | ORAL_TABLET | Freq: Every day | ORAL | Status: DC

## 2012-12-16 NOTE — Progress Notes (Signed)
Subjective:    Natalie David is a 52 y.o. female who presents for an annual exam. The patient has no complaints today. She would like a refill of her Lo lo estrin. The patient is sexually active. GYN screening history: last pap: was normal. The patient wears seatbelts: yes. The patient participates in regular exercise: yes. Has the patient ever been transfused or tattooed?: no. The patient reports that there is not domestic violence in her life.   Menstrual History: OB History   Grav Para Term Preterm Abortions TAB SAB Ect Mult Living   2 1 1  1   1  1       Menarche age: 34  Patient's last menstrual period was 12/16/2009.    The following portions of the patient's history were reviewed and updated as appropriate: allergies, current medications, past family history, past medical history, past social history, past surgical history and problem list.  Review of Systems A comprehensive review of systems was negative. 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 She has been married for 29 years and denies dyspareunia. She has had her flu vaccine this season. Her mammogram is next week. She is UTD with her colonoscopy.   Objective:    BP 93/61  Pulse 96  Resp 14  Ht 5' 8.5" (1.74 m)  Wt 126 lb (57.153 kg)  BMI 18.88 kg/m2  LMP 12/16/2009  General Appearance:    Alert, cooperative, no distress, appears stated age  Head:    Normocephalic, without obvious abnormality, atraumatic  Eyes:    PERRL, conjunctiva/corneas clear, EOM's intact, fundi    benign, both eyes  Ears:    Normal TM's and external ear canals, both ears  Nose:   Nares normal, septum midline, mucosa normal, no drainage    or sinus tenderness  Throat:   Lips, mucosa, and tongue normal; teeth and gums normal  Neck:   Supple, symmetrical, trachea midline, no adenopathy;    thyroid:  no enlargement/tenderness/nodules; no carotid   bruit or JVD  Back:     Symmetric, no curvature, ROM normal, no CVA tenderness  Lungs:      Clear to auscultation bilaterally, respirations unlabored  Chest Wall:    No tenderness or deformity   Heart:    Regular rate and rhythm, S1 and S2 normal, no murmur, rub   or gallop  Breast Exam:    No tenderness, masses, or nipple abnormality  Abdomen:     Soft, non-tender, bowel sounds active all four quadrants,    no masses, no organomegaly  Genitalia:    Normal female without lesion, discharge or tenderness, NSSA, NT, normal adnexal exam     Extremities:   Extremities normal, atraumatic, no cyanosis or edema  Pulses:   2+ and symmetric all extremities  Skin:   Skin color, texture, turgor normal, no rashes or lesions  Lymph nodes:   Cervical, supraclavicular, and axillary nodes normal  Neurologic:   CNII-XII intact, normal strength, sensation and reflexes    throughout  .    Assessment:    Healthy female exam.    Plan:     Breast self exam technique reviewed and patient encouraged to perform self-exam monthly. Thin prep Pap smear.  HPV cotesting

## 2012-12-20 ENCOUNTER — Encounter: Payer: Self-pay | Admitting: Internal Medicine

## 2012-12-20 ENCOUNTER — Ambulatory Visit
Admission: RE | Admit: 2012-12-20 | Discharge: 2012-12-20 | Disposition: A | Discharge status: Home / Self Care | Payer: 59 | Source: Ambulatory Visit | Attending: Obstetrics & Gynecology | Admitting: Obstetrics & Gynecology

## 2012-12-20 DIAGNOSIS — R921 Mammographic calcification found on diagnostic imaging of breast: Secondary | ICD-10-CM

## 2013-01-27 ENCOUNTER — Ambulatory Visit (AMBULATORY_SURGERY_CENTER): Payer: 59 | Admitting: *Deleted

## 2013-01-27 VITALS — Ht 68.0 in | Wt 128.8 lb

## 2013-01-27 DIAGNOSIS — Z1211 Encounter for screening for malignant neoplasm of colon: Secondary | ICD-10-CM

## 2013-01-27 DIAGNOSIS — Z860100 Personal history of colon polyps, unspecified: Secondary | ICD-10-CM

## 2013-01-27 MED ORDER — PEG-KCL-NACL-NASULF-NA ASC-C 100 G PO SOLR: ORAL | Status: DC

## 2013-01-27 NOTE — Progress Notes (Signed)
No egg or soy allergy 

## 2013-02-11 ENCOUNTER — Encounter: Payer: Self-pay | Admitting: Internal Medicine

## 2013-02-11 ENCOUNTER — Ambulatory Visit (AMBULATORY_SURGERY_CENTER): Payer: 59 | Admitting: Internal Medicine

## 2013-02-11 VITALS — BP 104/64 | HR 58 | Temp 97.6°F | Resp 17 | Ht 68.0 in | Wt 128.0 lb

## 2013-02-11 DIAGNOSIS — Z860100 Personal history of colon polyps, unspecified: Secondary | ICD-10-CM

## 2013-02-11 DIAGNOSIS — Z1211 Encounter for screening for malignant neoplasm of colon: Secondary | ICD-10-CM

## 2013-02-11 MED ORDER — SODIUM CHLORIDE 0.9 % IV SOLN: 500.0000 mL | INTRAVENOUS | Status: DC

## 2013-02-11 NOTE — Progress Notes (Signed)
A/ox3 pleased with MAC report to Kristen RN 

## 2013-02-11 NOTE — Progress Notes (Signed)
Patient did not experience any of the following events: a burn prior to discharge; a fall within the facility; wrong site/side/patient/procedure/implant event; or a hospital transfer or hospital admission upon discharge from the facility. (G8907) Patient did not have preoperative order for IV antibiotic SSI prophylaxis. (G8918)  

## 2013-02-11 NOTE — Patient Instructions (Addendum)
YOU HAD AN ENDOSCOPIC PROCEDURE TODAY AT THE [facility]: Refer to the procedure report that was given to you for any specific questions about what was found during the examination.  If the procedure report does not answer your questions, please call your gastroenterologist to clarify.  If you requested that your care partner not be given the details of your procedure findings, then the procedure report has been included in a sealed envelope for you to review at your convenience later.  YOU SHOULD EXPECT: Some feelings of bloating in the abdomen. Passage of more gas than usual.  Walking can help get rid of the air that was put into your GI tract during the procedure and reduce the bloating. If you had a lower endoscopy (such as a colonoscopy or flexible sigmoidoscopy) you may notice spotting of blood in your stool or on the toilet paper. If you underwent a bowel prep for your procedure, then you may not have a normal bowel movement for a few days.  DIET: Your first meal following the procedure should be a light meal and then it is ok to progress to your normal diet.  A half-sandwich or bowl of soup is an example of a good first meal.  Heavy or fried foods are harder to digest and may make you feel nauseous or bloated.  Likewise meals heavy in dairy and vegetables can cause extra gas to form and this can also increase the bloating.  Drink plenty of fluids but you should avoid alcoholic beverages for 24 hours.  ACTIVITY: Your care partner should take you home directly after the procedure.  You should plan to take it easy, moving slowly for the rest of the day.  You can resume normal activity the day after the procedure however you should NOT DRIVE or use heavy machinery for 24 hours (because of the sedation medicines used during the test).    SYMPTOMS TO REPORT IMMEDIATELY: A gastroenterologist can be reached at any hour.  During normal business hours, 8:30 AM to 5:00 PM Monday through Friday,  call [phone].  After hours and on weekends, please call the GI answering service at [phone] who will take a message and have the physician on call contact you.   Following lower endoscopy (colonoscopy or flexible sigmoidoscopy):  Excessive amounts of blood in the stool  Significant tenderness or worsening of abdominal pains  Swelling of the abdomen that is new, acute  Fever of 100F or higher   FOLLOW UP: Our staff will call the home number listed on your records the next business day following your procedure to check on you and address any questions or concerns that you may have at that time regarding the information given to you following your procedure. This is a courtesy call and so if there is no answer at the home number and we have not heard from you through the emergency physician on call, we will assume that you have returned to your regular daily activities without incident.  SIGNATURES/CONFIDENTIALITY: You and/or your care partner have signed paperwork which will be entered into your electronic medical record.  These signatures attest to the fact that that the information above on your After Visit Summary has been reviewed and is understood.  Full responsibility of the confidentiality of this discharge information lies with you and/or your care-partner.  Please continue your normal medications  Follow up colonoscopy in 10 years 

## 2013-02-11 NOTE — Op Note (Signed)
[facility] [address] [place], [address]   COLONOSCOPY PROCEDURE REPORT  PATIENT: Natalie, David.  MR#: [id number] BIRTHDATE: Jun 01, 1961 , 52  yrs. old GENDER: Female ENDOSCOPIST: Roxy Cedar, MD REFERRED [id number] Program Recall PROCEDURE DATE:  02/11/2013 PROCEDURE:   Colonoscopy, surveillance ASA CLASS:   Class I INDICATIONS:Patient's personal history of adenomatous colon polyps. Index 2003 (TA); f/u 2009 (-) MEDICATIONS: MAC sedation, administered by CRNA and propofol (Diprivan) 200mg  IV  DESCRIPTION OF PROCEDURE:   After the risks benefits and alternatives of the procedure were thoroughly explained, informed consent was obtained.  A digital rectal exam revealed no abnormalities of the rectum.   The LB CF-[id number]  endoscope was introduced through the anus and advanced to the cecum, which was identified by both the appendix and ileocecal valve. No adverse events experienced.   The quality of the prep was excellent, using MoviPrep  The instrument was then slowly withdrawn as the colon was fully examined.      COLON FINDINGS: A normal appearing cecum, ileocecal valve, and appendiceal orifice were identified.  The ascending, hepatic flexure, transverse, splenic flexure, descending, sigmoid colon and rectum appeared unremarkable.  No polyps or cancers were seen. Retroflexed views revealed internal hemorrhoids. The time to cecum=5 minutes 09 seconds.  Withdrawal time=10 minutes 05 seconds. The scope was withdrawn and the procedure completed. COMPLICATIONS: There were no complications.  ENDOSCOPIC IMPRESSION: 1. Normal colon  RECOMMENDATIONS: 1. Continue current colorectal screening recommendations for "routine risk" patients with a repeat colonoscopy in 10 years.   eSigned:  Roxy Cedar, MD 02/11/2013 8:44 AM   cc: The Patient and Nani Gasser, MD

## 2013-02-14 ENCOUNTER — Telehealth: Payer: Self-pay | Admitting: *Deleted

## 2013-02-14 NOTE — Telephone Encounter (Signed)
  Follow up Call-  Call back number 02/11/2013  Post procedure Call Back phone  # [phone]  Permission to leave phone message Yes     Patient questions:  Do you have a fever, pain , or abdominal swelling? no Pain Score  0 *  Have you tolerated food without any problems? yes  Have you been able to return to your normal activities? yes  Do you have any questions about your discharge instructions: Diet   no Medications  no Follow up visit  no  Do you have questions or concerns about your Care? no  Actions: * If pain score is 4 or above: No action needed, pain <4.

## 2013-04-20 ENCOUNTER — Encounter: Payer: Self-pay | Admitting: Physician Assistant

## 2013-04-20 ENCOUNTER — Ambulatory Visit (INDEPENDENT_AMBULATORY_CARE_PROVIDER_SITE_OTHER): Payer: 59 | Admitting: Physician Assistant

## 2013-04-20 VITALS — BP 103/58 | HR 84 | Ht 68.6 in | Wt 128.0 lb

## 2013-04-20 DIAGNOSIS — L0390 Cellulitis, unspecified: Secondary | ICD-10-CM

## 2013-04-20 DIAGNOSIS — T148XXA Other injury of unspecified body region, initial encounter: Secondary | ICD-10-CM

## 2013-04-20 DIAGNOSIS — N764 Abscess of vulva: Secondary | ICD-10-CM

## 2013-04-20 DIAGNOSIS — L0291 Cutaneous abscess, unspecified: Secondary | ICD-10-CM

## 2013-04-20 MED ORDER — SULFAMETHOXAZOLE-TRIMETHOPRIM 800-160 MG PO TABS: 1.0000 | ORAL_TABLET | Freq: Two times a day (BID) | ORAL | Status: DC

## 2013-04-20 NOTE — Patient Instructions (Signed)
Abscess An abscess is an infected area that contains a collection of pus and debris.It can occur in almost any part of the body. An abscess is also known as a furuncle or boil. CAUSES  An abscess occurs when tissue gets infected. This can occur from blockage of oil or sweat glands, infection of hair follicles, or a minor injury to the skin. As the body tries to fight the infection, pus collects in the area and creates pressure under the skin. This pressure causes pain. People with weakened immune systems have difficulty fighting infections and get certain abscesses more often.  SYMPTOMS Usually an abscess develops on the skin and becomes a painful mass that is red, warm, and tender. If the abscess forms under the skin, you may feel a moveable soft area under the skin. Some abscesses break open (rupture) on their own, but most will continue to get worse without care. The infection can spread deeper into the body and eventually into the bloodstream, causing you to feel ill.  DIAGNOSIS  Your caregiver will take your medical history and perform a physical exam. A sample of fluid may also be taken from the abscess to determine what is causing your infection. TREATMENT  Your caregiver may prescribe antibiotic medicines to fight the infection. However, taking antibiotics alone usually does not cure an abscess. Your caregiver may need to make a small cut (incision) in the abscess to drain the pus. In some cases, gauze is packed into the abscess to reduce pain and to continue draining the area. HOME CARE INSTRUCTIONS   Only take over-the-counter or prescription medicines for pain, discomfort, or fever as directed by your caregiver.  If you were prescribed antibiotics, take them as directed. Finish them even if you start to feel better.  If gauze is used, follow your caregiver's directions for changing the gauze.  To avoid spreading the infection:  Keep your draining abscess covered with a  bandage.  Wash your hands well.  Do not share personal care items, towels, or whirlpools with others.  Avoid skin contact with others.  Keep your skin and clothes clean around the abscess.  Keep all follow-up appointments as directed by your caregiver. SEEK MEDICAL CARE IF:   You have increased pain, swelling, redness, fluid drainage, or bleeding.  You have muscle aches, chills, or a general ill feeling.  You have a fever. MAKE SURE YOU:   Understand these instructions.  Will watch your condition.  Will get help right away if you are not doing well or get worse. Document Released: 07/09/2005 Document Revised: 03/30/2012 Document Reviewed: 12/12/2011 ExitCare Patient Information 2014 [employer].  

## 2013-04-20 NOTE — Progress Notes (Signed)
  Subjective:    Patient ID: Natalie David, female    DOB: 23-Nov-1960, 52 y.o.   MRN: [id number]  HPI Patient presents to the clinic with a painful nodule with bruising on the right side of labia. She noticed the knot on Sunday night 3 nights ago. The nodule continues to become more and more painful and tender to touch. No drainage present. Denies any fever, chills. No pain with urination. No trauma known to area to cause bruising. She has had abcess that needed lancing before but was a couple of years ago.    Review of Systems     Objective:   Physical Exam  Genitourinary:             Assessment & Plan:  Abcess/cellulitis- Incision and Drainage Procedure Note  Pre-operative Diagnosis: infected painful abcess  Post-operative Diagnosis: same  Indications: painful infected abscess  Anesthesia: 2 percent lidocaine without epi  Procedure Details  The procedure, risks and complications have been discussed in detail (including, but not limited to airway compromise, infection, bleeding) with the patient, and the patient has signed consent to the procedure.  The skin was sterilely prepped and draped over the affected area in the usual fashion. After adequate local anesthesia, I&D with a #11 blade was performed on the right, lower, labia. Purulent drainage: present The patient was observed until stable.  Findings: Purulent drainage from infected abcess  EBL: scant  Condition: Tolerated procedure well   Complications: none.  Keep covered for 24-48 hours. Follow up in 1 week with Dr. Linford Arnold to recheck. Bactrim given for 10 days. Cold compresses.

## 2013-05-19 ENCOUNTER — Ambulatory Visit (INDEPENDENT_AMBULATORY_CARE_PROVIDER_SITE_OTHER): Payer: 59 | Admitting: Family Medicine

## 2013-05-19 ENCOUNTER — Encounter: Payer: Self-pay | Admitting: Family Medicine

## 2013-05-19 VITALS — BP 95/59 | HR 80 | Ht 68.6 in | Wt 127.0 lb

## 2013-05-19 DIAGNOSIS — N183 Chronic kidney disease, stage 3 unspecified: Secondary | ICD-10-CM

## 2013-05-19 LAB — BASIC METABOLIC PANEL WITH GFR
BUN: 15 mg/dL (ref 6–23)
Chloride: 104 mEq/L (ref 96–112)
GFR, Est Non African American: 59 mL/min — ABNORMAL LOW
Potassium: 4.2 mEq/L (ref 3.5–5.3)
Sodium: 138 mEq/L (ref 135–145)

## 2013-05-19 NOTE — Progress Notes (Signed)
Subjective:    Patient ID: Natalie David, female    DOB: [birth year], 52 y.o.   MRN: [id number]  HPI Here to discuss CKD 3. She has been off NSAIDs for the last 4-5 months. Here to review results. She did actually pull some old labs from as far back as 4 years ago and noted that her GFR was about the same. There were some labs that her GYN had done. The oldest labs we have that back about a year. Has been stable at least during that time. She does not have hypertension or diabetes. Interested in her mother does have a history of chronic kidney disease. But her creatinine is around 3.0 but she also took Vioxx for years and they felt like that may have caused some kidney injury. Right now she has to away from and have him force for her back and is primarily using her Flexeril and Tylenol when needed.   Review of Systems  BP 95/59  Pulse 80  Ht 5' 8.6" (1.742 m)  Wt 127 lb (57.607 kg)  BMI 18.98 kg/m2    Allergies  Allergen Reactions  . Codeine Nausea Only    Past Medical History  Diagnosis Date  . Tachycardia   . Osteoporosis     Past Surgical History  Procedure Laterality Date  . Blepharoplasty  2011  . Breast enhancement surgery  2010  . Nasal septum surgery    . Laparotomy      Rt ectopic  . Lymph node biopsy      Rt underarm  . Colonoscopy    . Laparoscopy      for endometriosis    History   Social History  . Marital Status: Married    Spouse Name: Ambria Mayfield    Number of Children: 1   . Years of Education: N/A   Occupational History  . 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 Smoking status: Never Smoker   . Smokeless tobacco: Never Used  . Alcohol Use: 1.8 oz/week    3 Glasses of wine per week  . Drug Use: No  . Sexually Active: Yes -- Female partner(s)    Birth Control/ Protection: Pill   Other Topics Concern  . Not on file   Social History Narrative   Some exercise.     Family History  Problem Relation Age of Onset   . Heart disease Father   . Hypertension Father   . Thyroid disease Father   . Hypertension Sister   . Thyroid disease    . Hyperlipidemia Daughter   . Hyperlipidemia Sister   . Kidney disease Mother   . Colon cancer Neg Hx   . Esophageal cancer Neg Hx   . Rectal cancer Neg Hx   . Stomach cancer Neg Hx     Outpatient Encounter Prescriptions as of 05/19/2013  Medication Sig Dispense Refill  . Calcium Carbonate-Vitamin D (CALCIUM 600+D3) 600-400 MG-UNIT per tablet Take 1 tablet by mouth 2 (two) times daily.      . cholecalciferol (VITAMIN D) 1000 UNITS tablet Take 1,000 Units by mouth daily.      . metoprolol succinate (TOPROL-XL) 50 MG 24 hr tablet Take 1/2-1 tablet by mouth daily  90 tablet  3  . Norethindrone-Ethinyl Estradiol-Fe Biphas (LO LOESTRIN FE) 1 MG-10 MCG / 10 MCG tablet Take 1 tablet by mouth daily.  3 Package  5  . Omega-3 Fatty Acids (FISH OIL) 1000 MG  CAPS Take by mouth 2 (two) times daily.      . [DISCONTINUED] sulfamethoxazole-trimethoprim (SEPTRA DS) 800-160 MG per tablet Take 1 tablet by mouth 2 (two) times daily. For 10 days.  20 tablet  0   No facility-administered encounter medications on file as of 05/19/2013.          Objective:   Physical Exam  Constitutional: She is oriented to person, place, and time. She appears well-developed and well-nourished.  HENT:  Head: Normocephalic and atraumatic.  Neurological: She is alert and oriented to person, place, and time.  Skin: Skin is warm and dry.  Psychiatric: She has a normal mood and affect. Her behavior is normal.          Assessment & Plan:  CKD 3 - Will check urine micro and will order [place].  Will check Cr/BUN. We'll call with results once they're available. I recommend checking and following the creatinine and GFR at least every 6 months.  Low back pain-continue the Flexeril and Tylenol as needed and continue to avoid anti-inflammatories for extended periods of time. Certainly occasionally for day or 2  is still reasonable. Her medications do not have to be renally dosed at this point in time.

## 2013-05-21 LAB — MICROALBUMIN / CREATININE URINE RATIO
Creatinine, Urine: 53.6 mg/dL
Microalb Creat Ratio: 9.3 mg/g (ref 0.0–30.0)
Microalb, Ur: 0.5 mg/dL (ref 0.00–1.89)

## 2013-05-25 ENCOUNTER — Ambulatory Visit (HOSPITAL_BASED_OUTPATIENT_CLINIC_OR_DEPARTMENT_OTHER)
Admission: RE | Admit: 2013-05-25 | Discharge: 2013-05-25 | Disposition: A | Discharge status: Home / Self Care | Payer: 59 | Source: Ambulatory Visit | Attending: Family Medicine | Admitting: Family Medicine

## 2013-05-25 DIAGNOSIS — N183 Chronic kidney disease, stage 3 unspecified: Secondary | ICD-10-CM | POA: Insufficient documentation

## 2013-06-06 ENCOUNTER — Other Ambulatory Visit: Payer: Self-pay | Admitting: Family Medicine

## 2013-07-12 ENCOUNTER — Encounter: Payer: Self-pay | Admitting: Family Medicine

## 2013-11-18 ENCOUNTER — Other Ambulatory Visit: Payer: Self-pay

## 2013-11-18 DIAGNOSIS — Z1231 Encounter for screening mammogram for malignant neoplasm of breast: Secondary | ICD-10-CM

## 2013-12-14 ENCOUNTER — Encounter: Payer: Self-pay | Admitting: Family Medicine

## 2013-12-14 ENCOUNTER — Other Ambulatory Visit: Payer: Self-pay | Admitting: *Deleted

## 2013-12-14 ENCOUNTER — Ambulatory Visit (INDEPENDENT_AMBULATORY_CARE_PROVIDER_SITE_OTHER): Payer: 59 | Admitting: Family Medicine

## 2013-12-14 VITALS — BP 93/54 | HR 67 | Temp 97.9°F | Ht 68.6 in | Wt 129.0 lb

## 2013-12-14 DIAGNOSIS — Z0000 Encounter for general adult medical examination without abnormal findings: Secondary | ICD-10-CM

## 2013-12-14 DIAGNOSIS — M8580 Other specified disorders of bone density and structure, unspecified site: Secondary | ICD-10-CM

## 2013-12-14 DIAGNOSIS — R000 Tachycardia, unspecified: Secondary | ICD-10-CM

## 2013-12-14 DIAGNOSIS — N183 Chronic kidney disease, stage 3 unspecified: Secondary | ICD-10-CM

## 2013-12-14 DIAGNOSIS — M899 Disorder of bone, unspecified: Secondary | ICD-10-CM

## 2013-12-14 DIAGNOSIS — M949 Disorder of cartilage, unspecified: Secondary | ICD-10-CM

## 2013-12-14 LAB — COMPLETE METABOLIC PANEL WITH GFR
ALBUMIN: 4.6 g/dL (ref 3.5–5.2)
ALT: 11 U/L (ref 0–35)
AST: 18 U/L (ref 0–37)
Alkaline Phosphatase: 50 U/L (ref 39–117)
BUN: 20 mg/dL (ref 6–23)
CALCIUM: 9.6 mg/dL (ref 8.4–10.5)
CO2: 24 mEq/L (ref 19–32)
CREATININE: 1.06 mg/dL (ref 0.50–1.10)
Chloride: 101 mEq/L (ref 96–112)
GFR, EST NON AFRICAN AMERICAN: 60 mL/min
GFR, Est African American: 69 mL/min
GLUCOSE: 88 mg/dL (ref 70–99)
POTASSIUM: 4.6 meq/L (ref 3.5–5.3)
Sodium: 136 mEq/L (ref 135–145)
Total Bilirubin: 0.8 mg/dL (ref 0.2–1.2)
Total Protein: 7.7 g/dL (ref 6.0–8.3)

## 2013-12-14 LAB — LIPID PANEL
Cholesterol: 169 mg/dL (ref 0–200)
HDL: 67 mg/dL (ref >39)
LDL CALC: 78 mg/dL (ref 0–99)
TRIGLYCERIDES: 120 mg/dL (ref <150)
Total CHOL/HDL Ratio: 2.5 Ratio
VLDL: 24 mg/dL (ref 0–40)

## 2013-12-14 LAB — CBC
HCT: 39.9 % (ref 36.0–46.0)
Hemoglobin: 13.8 g/dL (ref 12.0–15.0)
MCH: 33.7 pg (ref 26.0–34.0)
MCHC: 34.6 g/dL (ref 30.0–36.0)
MCV: 97.6 fL (ref 78.0–100.0)
PLATELETS: 193 10*3/uL (ref 150–400)
RBC: 4.09 MIL/uL (ref 3.87–5.11)
RDW: 12.4 % (ref 11.5–15.5)
WBC: 5.8 10*3/uL (ref 4.0–10.5)

## 2013-12-14 LAB — TSH: TSH: 1.087 u[IU]/mL (ref 0.350–4.500)

## 2013-12-14 MED ORDER — METOPROLOL SUCCINATE ER 25 MG PO TB24: 25.0000 mg | ORAL_TABLET | Freq: Every day | ORAL | Status: DC

## 2013-12-14 NOTE — Progress Notes (Signed)
  Subjective:     Natalie David is a 53 y.o. female and is here for a comprehensive physical exam. The patient reports no problems.  History   Social History  . Marital Status: Married    Spouse Name: Yuritza Paulhus    Number of Children: 1   . Years of Education: N/A   Occupational History  . 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 Smoking status: Never Smoker   . Smokeless tobacco: Never Used  . Alcohol Use: 1.8 oz/week    3 Glasses of wine per week  . Drug Use: No  . Sexual Activity: Yes    Partners: Male    Birth Control/ Protection: Pill   Other Topics Concern  . Not on file   Social History Narrative   Some exercise.    Health Maintenance  Topic Date Due  . Influenza Vaccine  05/13/2014  . Mammogram  12/21/2014  . Pap Smear  12/17/2015  . Tetanus/tdap  12/10/2021  . Colonoscopy  02/12/2023    The following portions of the patient's history were reviewed and updated as appropriate: allergies, current medications, past family history, past medical history, past social history, past surgical history and problem list.  Review of Systems A comprehensive review of systems was negative.   Objective:    BP 93/54  Pulse 67  Temp(Src) 97.9 F (36.6 C)  Ht 5' 8.6" (1.742 m)  Wt 129 lb (58.514 kg)  BMI 19.28 kg/m2  SpO2 100% General appearance: alert, cooperative and appears stated age Head: Normocephalic, without obvious abnormality, atraumatic Eyes: conj clear, EOMi PEERLA Ears: normal TM's and external ear canals both ears Nose: Nares normal. Septum midline. Mucosa normal. No drainage or sinus tenderness. Throat: lips, mucosa, and tongue normal; teeth and gums normal Neck: no adenopathy, no carotid bruit, no JVD, supple, symmetrical, trachea midline and thyroid not enlarged, symmetric, no tenderness/mass/nodules Back: symmetric, no curvature. ROM normal. No CVA tenderness. Lungs: clear to auscultation bilaterally Heart: regular  rate and rhythm, S1, S2 normal, no murmur, click, rub or gallop Abdomen: soft, non-tender; bowel sounds normal; no masses,  no organomegaly Extremities: extremities normal, atraumatic, no cyanosis or edema Pulses: 2+ and symmetric Skin: Skin color, texture, turgor normal. No rashes or lesions Lymph nodes: Cervical, supraclavicular, and axillary nodes normal. Neurologic: Alert and oriented X 3, normal strength and tone. Normal symmetric reflexes. Normal coordination and gait    Assessment:    Healthy female exam.     Plan:     See After Visit Summary for Counseling Recommendations  Keep up a regular exercise program and make sure you are eating a healthy diet Try to eat 4 servings of dairy a day, or if you are lactose intolerant take a calcium with vitamin D daily.  Your vaccines are up to date.   mammo scheduled for next week.   Due for bone density next year.   Patient request to have a hemoglobin A1c done. She says she's had borderline glucose in the past. Her last A1c was normal 2 years ago.  CK-recheck BUN and creatinine today. We'll also check urine for protein.  Osteopenia-continue calcium. We'll check vitamin D level. Continue with regular exercise. Due for bone density next year.

## 2013-12-14 NOTE — Patient Instructions (Signed)
Keep up a regular exercise program and make sure you are eating a healthy diet Try to eat 4 servings of dairy a day, or if you are lactose intolerant take a calcium with vitamin D daily.  Your vaccines are up to date.   

## 2013-12-15 LAB — URINALYSIS, MICROSCOPIC ONLY
BACTERIA UA: NONE SEEN
CRYSTALS: NONE SEEN
Casts: NONE SEEN
Squamous Epithelial / LPF: NONE SEEN

## 2013-12-15 LAB — URINALYSIS, ROUTINE W REFLEX MICROSCOPIC
Bilirubin Urine: NEGATIVE
Glucose, UA: NEGATIVE mg/dL
HGB URINE DIPSTICK: NEGATIVE
Ketones, ur: NEGATIVE mg/dL
Nitrite: NEGATIVE
PROTEIN: NEGATIVE mg/dL
Specific Gravity, Urine: 1.012 (ref 1.005–1.030)
UROBILINOGEN UA: 0.2 mg/dL (ref 0.0–1.0)
pH: 6 (ref 5.0–8.0)

## 2013-12-15 LAB — VITAMIN D 25 HYDROXY (VIT D DEFICIENCY, FRACTURES): VIT D 25 HYDROXY: 88 ng/mL (ref 30–89)

## 2013-12-15 LAB — HEMOGLOBIN A1C
Hgb A1c MFr Bld: 5.1 % (ref <5.7)
Mean Plasma Glucose: 100 mg/dL (ref <117)

## 2013-12-21 ENCOUNTER — Ambulatory Visit: Payer: 59

## 2013-12-21 ENCOUNTER — Ambulatory Visit
Admission: RE | Admit: 2013-12-21 | Discharge: 2013-12-21 | Disposition: A | Discharge status: Home / Self Care | Payer: 59 | Source: Ambulatory Visit

## 2013-12-21 DIAGNOSIS — Z1231 Encounter for screening mammogram for malignant neoplasm of breast: Secondary | ICD-10-CM

## 2014-01-12 ENCOUNTER — Encounter: Payer: Self-pay | Admitting: Obstetrics & Gynecology

## 2014-01-12 ENCOUNTER — Ambulatory Visit (INDEPENDENT_AMBULATORY_CARE_PROVIDER_SITE_OTHER): Payer: 59 | Admitting: Obstetrics & Gynecology

## 2014-01-12 VITALS — BP 90/60 | HR 86 | Resp 16 | Ht 68.5 in | Wt 129.0 lb

## 2014-01-12 DIAGNOSIS — Z1151 Encounter for screening for human papillomavirus (HPV): Secondary | ICD-10-CM

## 2014-01-12 DIAGNOSIS — Z124 Encounter for screening for malignant neoplasm of cervix: Secondary | ICD-10-CM

## 2014-01-12 DIAGNOSIS — Z01419 Encounter for gynecological examination (general) (routine) without abnormal findings: Secondary | ICD-10-CM

## 2014-01-12 MED ORDER — NORETHIN-ETH ESTRAD-FE BIPHAS 1 MG-10 MCG / 10 MCG PO TABS: 1.0000 | ORAL_TABLET | Freq: Every day | ORAL | Status: DC

## 2014-01-12 NOTE — Progress Notes (Signed)
   Subjective:    Patient ID: Natalie David, female    DOB: [birth year], 53 y.o.   MRN: [id number]  HPI  She is here for a pap smear. She had a complete physical exam with Dr. Madilyn Fireman.  Review of Systems     Objective:   Physical Exam  Breast- normal after implants Gyn exam- all normal, NSSA, NT, mobile, normal adnexal exam     Assessment & Plan:  Continue lo lo estrin RTC 1 year/prn sooner

## 2014-06-13 ENCOUNTER — Encounter: Payer: Self-pay | Admitting: Sports Medicine

## 2014-06-13 ENCOUNTER — Ambulatory Visit (INDEPENDENT_AMBULATORY_CARE_PROVIDER_SITE_OTHER): Payer: 59 | Admitting: Sports Medicine

## 2014-06-13 ENCOUNTER — Ambulatory Visit (HOSPITAL_COMMUNITY)
Admission: RE | Admit: 2014-06-13 | Discharge: 2014-06-13 | Disposition: A | Discharge status: Home / Self Care | Payer: 59 | Source: Ambulatory Visit | Attending: Sports Medicine | Admitting: Sports Medicine

## 2014-06-13 VITALS — BP 122/61 | Ht 68.5 in | Wt 130.0 lb

## 2014-06-13 DIAGNOSIS — M47812 Spondylosis without myelopathy or radiculopathy, cervical region: Secondary | ICD-10-CM | POA: Insufficient documentation

## 2014-06-13 DIAGNOSIS — M542 Cervicalgia: Secondary | ICD-10-CM | POA: Diagnosis present

## 2014-06-13 DIAGNOSIS — M5412 Radiculopathy, cervical region: Secondary | ICD-10-CM | POA: Diagnosis not present

## 2014-06-13 NOTE — Progress Notes (Addendum)
   Subjective:    Patient ID: Natalie David, female    DOB: [birth year], 53 y.o.   MRN: [id number]  HPI Natalie David is a 53yo right-hand dominant female who presents with right finger, hand, lateral forearm, and lateral upper arm numbness. Onset of symptoms was gradual 3-4 weeks ago. She denies any known acute injury. Frequency of symptoms is intermittent. Character is a tingling sensation. She notes no known aggravating or triggering factors. She notes associated right sided neck stiffness and pain. Relieving factor is if she places her right hand on her head.  She denies any weakness, dizziness, fevers, chills, night sweats, myalgias, or malaise.  Past Medical History  Diagnosis Date  . Tachycardia   . Osteoporosis     Review of Systems As per HPI. 11 point ROS was performed and is otherwise negative.    Objective:   Physical Exam BP 122/61  Ht 5' 8.5" (1.74 m)  Wt 130 lb (58.968 kg)  BMI 19.48 kg/m2 GEN: The patient is well-developed well-nourished female and in no acute distress.  She is awake alert and oriented x3. SKIN: warm and well-perfused, no rash  Neuro: Strength 5/5 globally. Sensation intact throughout. DTRs +2/4 bilaterally biceps, triceps, and brachioradialis. No focal deficits. Vasc: +2 bilateral distal pulses. No edema.  MSK: Examination of the right upper extremity reveals decreased pinprick sensation over the right thumb and medial 2nd finger. No thenar wasting. Intrinsic hand muscle strength intact. Normal thumb-pinky strength. Neg median nerve compression test. Neg tinel's at wrist and elbow. Negative tinel's at elbow. Neck ROM with mild pain with turning head towards the right. +Spurling's test right. +Tenderness over right trapezius with some muscle spasm  Cervical Spine X-rays ordered and obtained today, reviewed by me: Minimal mid cervical spondylosis. No disc narrowing or facet  spurring.     Assessment & Plan:  1. Cervicalgia with C6  radiculopathy  -Obtain X-rays of the cervical spine -Referral for physical therapy to work on cervical spine ROM and stretching -Recommend she take Tylenol extra strength if needed, avoid NSAIDs due to CKD Stage 3. I also offered her flexeril, which she declined at this time. -Plan for follow-up in 4-6 weeks if symptoms persist -Consider MRI Cervical Spine if symptoms do not improve with 6 weeks of conservative therapy.

## 2014-06-14 ENCOUNTER — Telehealth: Payer: Self-pay | Admitting: Sports Medicine

## 2014-06-14 NOTE — Telephone Encounter (Signed)
Attempted to reach patient at numbers provided- but only reached generic VMs. Did not leave a message as unsure if confidential VM. Calling to give patient Xray results of Cervical spine.  Overall the x-rays show minimal degeneration, with well-preserved disc spaces and vertebral heights.  The etiology of her symptoms is most likely a muscle in pinching one of her nerves, and should respond to physical therapy.  If it does not, then we should see her back and may consider an MRI of the cervical spine, as x-rays did not show the foraminal openings very well.

## 2014-07-03 ENCOUNTER — Ambulatory Visit: Payer: 59 | Attending: Family Medicine | Admitting: Physical Therapy

## 2014-07-03 DIAGNOSIS — M6281 Muscle weakness (generalized): Secondary | ICD-10-CM | POA: Insufficient documentation

## 2014-07-03 DIAGNOSIS — M542 Cervicalgia: Secondary | ICD-10-CM | POA: Insufficient documentation

## 2014-07-03 DIAGNOSIS — IMO0001 Reserved for inherently not codable concepts without codable children: Secondary | ICD-10-CM | POA: Insufficient documentation

## 2014-07-05 ENCOUNTER — Encounter: Payer: Self-pay | Admitting: Family Medicine

## 2014-07-13 ENCOUNTER — Ambulatory Visit: Payer: 59 | Attending: Family Medicine | Admitting: Physical Therapy

## 2014-07-13 DIAGNOSIS — M542 Cervicalgia: Secondary | ICD-10-CM | POA: Insufficient documentation

## 2014-07-13 DIAGNOSIS — M6281 Muscle weakness (generalized): Secondary | ICD-10-CM | POA: Insufficient documentation

## 2014-07-13 DIAGNOSIS — Z5189 Encounter for other specified aftercare: Secondary | ICD-10-CM | POA: Diagnosis present

## 2014-07-17 ENCOUNTER — Ambulatory Visit: Payer: 59 | Admitting: Physical Therapy

## 2014-07-17 DIAGNOSIS — Z5189 Encounter for other specified aftercare: Secondary | ICD-10-CM | POA: Diagnosis not present

## 2014-07-18 ENCOUNTER — Ambulatory Visit: Payer: 59 | Admitting: Sports Medicine

## 2014-07-20 ENCOUNTER — Ambulatory Visit: Payer: 59 | Admitting: Physical Therapy

## 2014-07-20 DIAGNOSIS — Z5189 Encounter for other specified aftercare: Secondary | ICD-10-CM | POA: Diagnosis not present

## 2014-07-24 ENCOUNTER — Ambulatory Visit: Payer: 59 | Admitting: Physical Therapy

## 2014-07-24 DIAGNOSIS — Z5189 Encounter for other specified aftercare: Secondary | ICD-10-CM | POA: Diagnosis not present

## 2014-07-27 ENCOUNTER — Ambulatory Visit: Payer: 59 | Admitting: Physical Therapy

## 2014-07-27 DIAGNOSIS — Z5189 Encounter for other specified aftercare: Secondary | ICD-10-CM | POA: Diagnosis not present

## 2014-08-01 ENCOUNTER — Encounter: Payer: Self-pay | Admitting: Sports Medicine

## 2014-08-01 ENCOUNTER — Ambulatory Visit (INDEPENDENT_AMBULATORY_CARE_PROVIDER_SITE_OTHER): Payer: 59 | Admitting: Sports Medicine

## 2014-08-01 VITALS — BP 93/64 | HR 87 | Ht 68.0 in | Wt 132.0 lb

## 2014-08-01 DIAGNOSIS — M6248 Contracture of muscle, other site: Secondary | ICD-10-CM

## 2014-08-01 DIAGNOSIS — M62838 Other muscle spasm: Secondary | ICD-10-CM

## 2014-08-01 NOTE — Progress Notes (Signed)
   Subjective:    Patient ID: Natalie David, female    DOB: [birth year], 53 y.o.   MRN: [id number]  HPI Natalie David presents for follow-up of left-sided neck and arm pain. She has undergone 6 visits of physical therapy, which has helped. She is now going to be doing a HEP for continued rehab. Her location of pain has been in the right superior trapezius region with some radiation of numbness/tingling down the right arm when her neck feels stiff. Cervical spine x-rays were obtained following her last visit which showed only mild mid cervical spondylosis, without facet or disc narrowing. She says her radicular symptoms are improving and she denies any significant residual weakness in her right upper extremity. She is taking occasional Tylenol, as she cannot take anti-inflammatories due to gastritis. She had also declined flexeril at her last visit. Onset of symptoms was about 3 months ago, now with improving course.  Past medical history, social history, medications, and allergies were reviewed and are up to date in the chart.  Review of Systems 7 point review of systems was performed and was otherwise negative unless noted in the history of present illness.     Objective:   Physical Exam BP 93/64  Pulse 87  Ht 5\' 8"  (1.727 m)  Wt 132 lb (59.875 kg)  BMI 20.08 kg/m2 GEN: The patient is well-developed well-nourished female and in no acute distress.  She is awake alert and oriented x3. SKIN: warm and well-perfused, no rash  EXTR: No lower extremity edema or calf tenderness Neuro: Strength 5/5 globally. Sensation intact throughout. DTRs 2/4 bilaterally. No focal deficits. Vasc: +2 bilateral distal pulses. No edema.  MSK: Full ROM in the cervical spine that is pain free. Full ROM RUE. Less trapezius spasm with palpation. Sensation intact throughout. Strength intact bilateral UEs. She has a slightly longer neck and sometimes hunches her shoulders.     Assessment & Plan:  1. Trapezius Muscle  Spasm with radiculopathy, unremarkable C-spine XRs. -Improving with PT and HEP -Patient declined muscle relaxant -Discussed continued postural mechanics and exercises -Patient is pleased with her progress. -Plan to follow-up as needed.

## 2014-08-14 ENCOUNTER — Encounter: Payer: Self-pay | Admitting: Sports Medicine

## 2014-09-13 ENCOUNTER — Other Ambulatory Visit: Payer: Self-pay | Admitting: *Deleted

## 2014-09-13 ENCOUNTER — Encounter: Payer: Self-pay | Admitting: Obstetrics & Gynecology

## 2014-09-13 ENCOUNTER — Other Ambulatory Visit: Payer: Self-pay | Admitting: Obstetrics and Gynecology

## 2014-09-13 DIAGNOSIS — Z3040 Encounter for surveillance of contraceptives, unspecified: Secondary | ICD-10-CM

## 2014-09-13 MED ORDER — NORETHIN ACE-ETH ESTRAD-FE 1-20 MG-MCG PO TABS: 1.0000 | ORAL_TABLET | Freq: Every day | ORAL | Status: DC

## 2014-10-17 ENCOUNTER — Telehealth: Payer: Self-pay | Admitting: *Deleted

## 2014-10-17 ENCOUNTER — Encounter: Payer: Self-pay | Admitting: Obstetrics & Gynecology

## 2014-10-17 DIAGNOSIS — Z30011 Encounter for initial prescription of contraceptive pills: Secondary | ICD-10-CM

## 2014-10-17 MED ORDER — NORETHIN ACE-ETH ESTRAD-FE 1-20 MG-MCG PO TABS: 1.0000 | ORAL_TABLET | Freq: Every day | ORAL | Status: DC

## 2014-10-17 NOTE — Telephone Encounter (Signed)
Pt called stating that she would like her OCP's for 90 days instead of a 30 day sent to [facility].  RX adjusted.

## 2014-11-16 ENCOUNTER — Other Ambulatory Visit: Payer: Self-pay

## 2014-11-16 DIAGNOSIS — Z1231 Encounter for screening mammogram for malignant neoplasm of breast: Secondary | ICD-10-CM

## 2014-12-21 ENCOUNTER — Other Ambulatory Visit (HOSPITAL_COMMUNITY)
Admission: RE | Admit: 2014-12-21 | Discharge: 2014-12-21 | Disposition: A | Discharge status: Home / Self Care | Payer: 59 | Source: Ambulatory Visit | Attending: Family Medicine | Admitting: Family Medicine

## 2014-12-21 ENCOUNTER — Ambulatory Visit (INDEPENDENT_AMBULATORY_CARE_PROVIDER_SITE_OTHER): Payer: 59 | Admitting: Family Medicine

## 2014-12-21 ENCOUNTER — Encounter: Payer: Self-pay | Admitting: Family Medicine

## 2014-12-21 ENCOUNTER — Other Ambulatory Visit: Payer: Self-pay | Admitting: Family Medicine

## 2014-12-21 VITALS — BP 100/67 | HR 74 | Wt 130.0 lb

## 2014-12-21 DIAGNOSIS — Z01419 Encounter for gynecological examination (general) (routine) without abnormal findings: Secondary | ICD-10-CM | POA: Diagnosis present

## 2014-12-21 DIAGNOSIS — M810 Age-related osteoporosis without current pathological fracture: Secondary | ICD-10-CM

## 2014-12-21 DIAGNOSIS — Z0000 Encounter for general adult medical examination without abnormal findings: Secondary | ICD-10-CM

## 2014-12-21 DIAGNOSIS — Z30011 Encounter for initial prescription of contraceptive pills: Secondary | ICD-10-CM

## 2014-12-21 DIAGNOSIS — R000 Tachycardia, unspecified: Secondary | ICD-10-CM

## 2014-12-21 DIAGNOSIS — Z1159 Encounter for screening for other viral diseases: Secondary | ICD-10-CM

## 2014-12-21 DIAGNOSIS — Z0189 Encounter for other specified special examinations: Secondary | ICD-10-CM

## 2014-12-21 DIAGNOSIS — K9049 Malabsorption due to intolerance, not elsewhere classified: Secondary | ICD-10-CM

## 2014-12-21 DIAGNOSIS — K904 Malabsorption due to intolerance, not elsewhere classified: Secondary | ICD-10-CM

## 2014-12-21 LAB — COMPLETE METABOLIC PANEL WITH GFR
ALK PHOS: 48 U/L (ref 39–117)
ALT: 13 U/L (ref 0–35)
AST: 20 U/L (ref 0–37)
Albumin: 4.7 g/dL (ref 3.5–5.2)
BILIRUBIN TOTAL: 0.9 mg/dL (ref 0.2–1.2)
BUN: 16 mg/dL (ref 6–23)
CHLORIDE: 104 meq/L (ref 96–112)
CO2: 28 meq/L (ref 19–32)
CREATININE: 1.13 mg/dL — AB (ref 0.50–1.10)
Calcium: 9.4 mg/dL (ref 8.4–10.5)
GFR, EST AFRICAN AMERICAN: 63 mL/min
GFR, EST NON AFRICAN AMERICAN: 55 mL/min — AB
Glucose, Bld: 89 mg/dL (ref 70–99)
POTASSIUM: 4.2 meq/L (ref 3.5–5.3)
Sodium: 141 mEq/L (ref 135–145)
Total Protein: 7.6 g/dL (ref 6.0–8.3)

## 2014-12-21 LAB — LIPID PANEL
CHOLESTEROL: 171 mg/dL (ref 0–200)
HDL: 73 mg/dL (ref >=46)
LDL Cholesterol: 76 mg/dL (ref 0–99)
TRIGLYCERIDES: 111 mg/dL (ref <150)
Total CHOL/HDL Ratio: 2.3 Ratio
VLDL: 22 mg/dL (ref 0–40)

## 2014-12-21 LAB — HIV ANTIBODY (ROUTINE TESTING W REFLEX): HIV 1&2 Ab, 4th Generation: NONREACTIVE

## 2014-12-21 LAB — HEPATITIS C ANTIBODY: HCV Ab: NEGATIVE

## 2014-12-21 LAB — TSH: TSH: 0.822 u[IU]/mL (ref 0.350–4.500)

## 2014-12-21 MED ORDER — NORETHIN ACE-ETH ESTRAD-FE 1-20 MG-MCG PO TABS: 1.0000 | ORAL_TABLET | Freq: Every day | ORAL | Status: DC

## 2014-12-21 MED ORDER — METOPROLOL SUCCINATE ER 25 MG PO TB24: 25.0000 mg | ORAL_TABLET | Freq: Every day | ORAL | Status: DC

## 2014-12-21 NOTE — Addendum Note (Signed)
Addended by: [name] D on: 12/21/2014 10:53 AM   Modules accepted: Orders

## 2014-12-21 NOTE — Progress Notes (Addendum)
  Subjective:     Natalie David is a 54 y.o. female and is here for a comprehensive physical exam. The patient reports no problems.  History   Social History  . Marital Status: Married    Spouse Name: Rateel Beldin  . Number of Children: 1   . Years of Education: N/A   Occupational History  . 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 Smoking status: Never Smoker   . Smokeless tobacco: Never Used  . Alcohol Use: 1.8 oz/week    3 Glasses of wine per week  . Drug Use: No  . Sexual Activity:    Partners: Male    Birth Control/ Protection: Pill   Other Topics Concern  . Not on file   Social History Narrative   Some exercise, 3 days per week. Drinks diet soda one a day.     Health Maintenance  Topic Date Due  . Hepatitis C Screening  Mar 13, 1961  . HIV Screening  12/09/1974  . MAMMOGRAM  12/21/2014  . INFLUENZA VACCINE  05/14/2015  . PAP SMEAR  01/12/2017  . TETANUS/TDAP  12/10/2021  . COLONOSCOPY  02/12/2023    The following portions of the patient's history were reviewed and updated as appropriate: allergies, current medications, past family history, past medical history, past social history, past surgical history and problem list.  Review of Systems  A comprehensive review of systems was negative.   Objective:    BP 100/67 mmHg  Pulse 74  Wt 130 lb (58.968 kg)  SpO2 100% General appearance: alert, cooperative and appears stated age Head: Normocephalic, without obvious abnormality, atraumatic Eyes: conj clear, EOM, PEERLA Ears: normal TM's and external ear canals both ears Nose: Nares normal. Septum midline. Mucosa normal. No drainage or sinus tenderness. Throat: lips, mucosa, and tongue normal; teeth and gums normal Neck: no adenopathy, no carotid bruit, no JVD, supple, symmetrical, trachea midline and thyroid not enlarged, symmetric, no tenderness/mass/nodules Back: symmetric, no curvature. ROM normal. No CVA tenderness. Lungs: clear  to auscultation bilaterally Breasts: normal appearance, no masses or tenderness, bilar implants Heart: regular rate and rhythm, S1, S2 normal, no murmur, click, rub or gallop Abdomen: soft, non-tender; bowel sounds normal; no masses,  no organomegaly Pelvic: cervix normal in appearance, external genitalia normal, no adnexal masses or tenderness, no cervical motion tenderness, rectovaginal septum normal, uterus normal size, shape, and consistency and vagina normal without discharge Extremities: extremities normal, atraumatic, no cyanosis or edema Pulses: 2+ and symmetric Skin: Skin color, texture, turgor normal. No rashes or lesions Lymph nodes: Cervical, supraclavicular, and axillary nodes normal. Neurologic: Alert and oriented X 3, normal strength and tone. Normal symmetric reflexes. Normal coordination and gait    Assessment:    Healthy female exam.      Plan:     See After Visit Summary for Counseling Recommendations   Keep up a regular exercise program and make sure you are eating a healthy diet Try to eat 4 servings of dairy a day, or if you are lactose intolerant take a calcium with vitamin D daily.  Your vaccines are up to date.  Schedule for her mammogram next Tuesday.    She does request to be tested for milk allergy today. She says she eats a bowl of cereal about once a week and when she does it tends to cause GI upset. She think she may be lactose intolerant but wants to be tested.

## 2014-12-21 NOTE — Patient Instructions (Signed)
Keep up a regular exercise program and make sure you are eating a healthy diet Try to eat 4 servings of dairy a day, or if you are lactose intolerant take a calcium with vitamin D daily.  Your vaccines are up to date.   

## 2014-12-22 ENCOUNTER — Other Ambulatory Visit: Payer: Self-pay | Admitting: *Deleted

## 2014-12-22 DIAGNOSIS — R7989 Other specified abnormal findings of blood chemistry: Secondary | ICD-10-CM

## 2014-12-22 LAB — CYTOLOGY - PAP

## 2014-12-22 NOTE — Progress Notes (Signed)
Quick Note:  Call patient: Your Pap smear is normal. Repeat in 3 years. Negative for hepatitis C and HIV. Cholesterol and thyroid is norma. Kidney function up again . Recheck in 4-6 months. ______

## 2014-12-25 LAB — ALLERGEN MILK: Milk IgE: <0.1 kU/L

## 2014-12-26 ENCOUNTER — Other Ambulatory Visit: Payer: Self-pay

## 2014-12-26 ENCOUNTER — Ambulatory Visit
Admission: RE | Admit: 2014-12-26 | Discharge: 2014-12-26 | Disposition: A | Discharge status: Home / Self Care | Payer: 59 | Source: Ambulatory Visit

## 2014-12-26 DIAGNOSIS — Z1231 Encounter for screening mammogram for malignant neoplasm of breast: Secondary | ICD-10-CM

## 2015-01-10 ENCOUNTER — Ambulatory Visit (INDEPENDENT_AMBULATORY_CARE_PROVIDER_SITE_OTHER): Payer: 59

## 2015-01-10 DIAGNOSIS — M859 Disorder of bone density and structure, unspecified: Secondary | ICD-10-CM

## 2015-01-10 DIAGNOSIS — M810 Age-related osteoporosis without current pathological fracture: Secondary | ICD-10-CM

## 2015-03-02 ENCOUNTER — Encounter: Payer: Self-pay | Admitting: Internal Medicine

## 2015-03-08 ENCOUNTER — Encounter: Payer: Self-pay | Admitting: Family Medicine

## 2015-05-15 ENCOUNTER — Encounter: Payer: Self-pay | Admitting: Family Medicine

## 2015-05-24 LAB — BASIC METABOLIC PANEL WITH GFR
BUN: 18 mg/dL (ref 7–25)
CO2: 25 mmol/L (ref 20–31)
Calcium: 8.8 mg/dL (ref 8.6–10.4)
Chloride: 102 mmol/L (ref 98–110)
Creat: 1.08 mg/dL — ABNORMAL HIGH (ref 0.50–1.05)
GFR, Est African American: 67 mL/min (ref >=60)
GFR, Est Non African American: 58 mL/min — ABNORMAL LOW (ref >=60)
GLUCOSE: 79 mg/dL (ref 65–99)
Potassium: 4.1 mmol/L (ref 3.5–5.3)
SODIUM: 138 mmol/L (ref 135–146)

## 2015-11-20 ENCOUNTER — Other Ambulatory Visit: Payer: Self-pay

## 2015-11-20 DIAGNOSIS — Z1231 Encounter for screening mammogram for malignant neoplasm of breast: Secondary | ICD-10-CM

## 2015-11-27 MED FILL — LARIN FE 1-20 TABLET: 1-20 | 84 days supply | Qty: 84 | Fill #0

## 2015-11-27 MED FILL — METOPROLOL SUCC ER 25 MG TA: 25 | 90 days supply | Qty: 90 | Fill #3

## 2015-12-25 ENCOUNTER — Ambulatory Visit (INDEPENDENT_AMBULATORY_CARE_PROVIDER_SITE_OTHER): Payer: 59 | Admitting: Family Medicine

## 2015-12-25 ENCOUNTER — Encounter: Payer: Self-pay | Admitting: Family Medicine

## 2015-12-25 VITALS — BP 104/63 | HR 75

## 2015-12-25 DIAGNOSIS — R718 Other abnormality of red blood cells: Secondary | ICD-10-CM

## 2015-12-25 DIAGNOSIS — N183 Chronic kidney disease, stage 3 unspecified: Secondary | ICD-10-CM

## 2015-12-25 DIAGNOSIS — R000 Tachycardia, unspecified: Secondary | ICD-10-CM | POA: Diagnosis not present

## 2015-12-25 DIAGNOSIS — M8580 Other specified disorders of bone density and structure, unspecified site: Secondary | ICD-10-CM | POA: Diagnosis not present

## 2015-12-25 DIAGNOSIS — Z79890 Hormone replacement therapy (postmenopausal): Secondary | ICD-10-CM

## 2015-12-25 DIAGNOSIS — M545 Low back pain, unspecified: Secondary | ICD-10-CM

## 2015-12-25 DIAGNOSIS — Z30011 Encounter for initial prescription of contraceptive pills: Secondary | ICD-10-CM

## 2015-12-25 DIAGNOSIS — Z0000 Encounter for general adult medical examination without abnormal findings: Secondary | ICD-10-CM | POA: Diagnosis not present

## 2015-12-25 MED ORDER — CYCLOBENZAPRINE HCL 10 MG PO TABS: 10.0000 mg | ORAL_TABLET | Freq: Three times a day (TID) | ORAL | Status: AC | PRN

## 2015-12-25 MED ORDER — DROSPIRENONE-ESTRADIOL 0.5-1 MG PO TABS: 1.0000 | ORAL_TABLET | Freq: Every day | ORAL | Status: DC

## 2015-12-25 MED ORDER — METOPROLOL SUCCINATE ER 25 MG PO TB24: 25.0000 mg | ORAL_TABLET | Freq: Every day | ORAL | Status: DC

## 2015-12-25 MED FILL — CYCLOBENZAPRINE 10 MG TAB: 10 | 10 days supply | Qty: 30 | Fill #0

## 2015-12-25 NOTE — Progress Notes (Signed)
  Subjective:     Natalie David is a 55 y.o. female and is here for a comprehensive physical exam. The patient reports no problems.  Social History   Social History  . Marital Status: Married    Spouse Name: Arthi Tonn  . Number of Children: 1   . Years of Education: N/A   Occupational History  . 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 Smoking status: Never Smoker   . Smokeless tobacco: Never Used  . Alcohol Use: 1.8 oz/week    3 Glasses of wine per week  . Drug Use: No  . Sexual Activity:    Partners: Male    Birth Control/ Protection: Pill   Other Topics Concern  . Not on file   Social History Narrative   Some exercise, 3 days per week. Drinks diet soda one a day.     Health Maintenance  Topic Date Due  . INFLUENZA VACCINE  05/13/2016  . MAMMOGRAM  12/25/2016  . PAP SMEAR  12/20/2017  . TETANUS/TDAP  12/10/2021  . COLONOSCOPY  02/12/2023  . Hepatitis C Screening  Completed  . HIV Screening  Completed    The following portions of the patient's history were reviewed and updated as appropriate: allergies, current medications, past family history, past medical history, past social history, past surgical history and problem list.  Review of Systems Pertinent items noted in HPI and remainder of comprehensive ROS otherwise negative.   Objective:    BP 104/63 mmHg  Pulse 75  SpO2 100%  LMP 12/16/2009 General appearance: alert, cooperative and appears stated age Head: Normocephalic, without obvious abnormality, atraumatic Eyes: conj clear, EOMI< PEERLA Ears: normal TM's and external ear canals both ears Nose: Nares normal. Septum midline. Mucosa normal. No drainage or sinus tenderness. Throat: lips, mucosa, and tongue normal; teeth and gums normal Neck: no adenopathy, no carotid bruit, no JVD, supple, symmetrical, trachea midline and thyroid not enlarged, symmetric, no tenderness/mass/nodules Back: symmetric, no curvature. ROM normal.  No CVA tenderness. Lungs: clear to auscultation bilaterally Breasts: normal appearance, no masses or tenderness Heart: regular rate and rhythm, S1, S2 normal, no murmur, click, rub or gallop Abdomen: soft, non-tender; bowel sounds normal; no masses,  no organomegaly Extremities: extremities normal, atraumatic, no cyanosis or edema Pulses: 2+ and symmetric Skin: Skin color, texture, turgor normal. No rashes or lesions Lymph nodes: Cervical, supraclavicular, and axillary nodes normal. Neurologic: Alert and oriented X 3, normal strength and tone. Normal symmetric reflexes. Normal coordination and gait    Assessment:    Healthy female exam.    Plan:     See After Visit Summary for Counseling Recommendations  Keep up a regular exercise program and make sure you are eating a healthy diet Try to eat 4 servings of dairy a day, or if you are lactose intolerant take a calcium with vitamin D daily.  Your vaccines are up to date.   CKD 3 - continiue to monitor. Will recheck.   HRT - she has been taking her OCPs every other day. Will change to actual HRT and take her off of birth control.  Change to Angeliq.  Tachycardia - refilled her metoprolol.   She has occ low back pain. OK to refill her flexeril.

## 2015-12-26 ENCOUNTER — Telehealth: Payer: Self-pay

## 2015-12-26 ENCOUNTER — Encounter: Payer: Self-pay | Admitting: Family Medicine

## 2015-12-26 LAB — COMPLETE METABOLIC PANEL WITH GFR
ALT: 11 U/L (ref 6–29)
AST: 18 U/L (ref 10–35)
Albumin: 3.9 g/dL (ref 3.6–5.1)
Alkaline Phosphatase: 43 U/L (ref 33–130)
BUN: 12 mg/dL (ref 7–25)
CALCIUM: 9.3 mg/dL (ref 8.6–10.4)
CHLORIDE: 104 mmol/L (ref 98–110)
CO2: 24 mmol/L (ref 20–31)
Creat: 1.06 mg/dL — ABNORMAL HIGH (ref 0.50–1.05)
GFR, EST AFRICAN AMERICAN: 68 mL/min (ref >=60)
GFR, EST NON AFRICAN AMERICAN: 59 mL/min — AB (ref >=60)
Glucose, Bld: 81 mg/dL (ref 65–99)
POTASSIUM: 4.2 mmol/L (ref 3.5–5.3)
Sodium: 139 mmol/L (ref 135–146)
Total Bilirubin: 0.8 mg/dL (ref 0.2–1.2)
Total Protein: 7.2 g/dL (ref 6.1–8.1)

## 2015-12-26 LAB — TSH: TSH: 0.74 mIU/L

## 2015-12-26 LAB — LIPID PANEL
Cholesterol: 165 mg/dL (ref 125–200)
HDL: 71 mg/dL (ref >=46)
LDL CALC: 73 mg/dL (ref <130)
TRIGLYCERIDES: 103 mg/dL (ref <150)
Total CHOL/HDL Ratio: 2.3 Ratio (ref <=5.0)
VLDL: 21 mg/dL (ref <30)

## 2015-12-26 MED ORDER — ESTRADIOL-NORETHINDRONE ACET 1-0.5 MG PO TABS: 1.0000 | ORAL_TABLET | Freq: Every day | ORAL | Status: DC

## 2015-12-26 MED FILL — ESTRADIOL-NORETH 1.0-0.5 MG: 1-0.5 | 84 days supply | Qty: 84 | Fill #0

## 2015-12-26 NOTE — Telephone Encounter (Signed)
Can you call the pharmacist and see what they would recommend that is close.

## 2015-12-26 NOTE — Telephone Encounter (Signed)
OK new rx sent for Activella.

## 2015-12-26 NOTE — Telephone Encounter (Signed)
Notified patient of new medication sent to pharmacy.

## 2015-12-27 ENCOUNTER — Ambulatory Visit
Admission: RE | Admit: 2015-12-27 | Discharge: 2015-12-27 | Disposition: A | Discharge status: Home / Self Care | Payer: 59 | Source: Ambulatory Visit

## 2015-12-27 DIAGNOSIS — Z1231 Encounter for screening mammogram for malignant neoplasm of breast: Secondary | ICD-10-CM | POA: Diagnosis not present

## 2015-12-27 LAB — VITAMIN D 25 HYDROXY (VIT D DEFICIENCY, FRACTURES): Vit D, 25-Hydroxy: 58 ng/mL (ref 30–100)

## 2015-12-27 LAB — HEMOGLOBIN A1C
Hgb A1c MFr Bld: 5.2 % (ref <5.7)
MEAN PLASMA GLUCOSE: 103 mg/dL (ref <117)

## 2016-02-20 DIAGNOSIS — H5213 Myopia, bilateral: Secondary | ICD-10-CM | POA: Diagnosis not present

## 2016-02-20 DIAGNOSIS — Z0100 Encounter for examination of eyes and vision without abnormal findings: Secondary | ICD-10-CM | POA: Diagnosis not present

## 2016-02-28 MED FILL — METOPROLOL SUCC ER 25 MG TA: 25 | 90 days supply | Qty: 90 | Fill #0

## 2016-04-25 MED FILL — ESTRADIOL-NORETH 1.0-0.5 MG: 1-0.5 | 84 days supply | Qty: 84 | Fill #1

## 2016-05-26 MED FILL — METOPROLOL SUCC ER 25 MG TA: 25 | 90 days supply | Qty: 90 | Fill #1

## 2016-07-15 ENCOUNTER — Encounter: Payer: Self-pay | Admitting: Family Medicine

## 2016-07-15 ENCOUNTER — Other Ambulatory Visit: Payer: Self-pay

## 2016-07-15 DIAGNOSIS — N289 Disorder of kidney and ureter, unspecified: Secondary | ICD-10-CM | POA: Diagnosis not present

## 2016-07-17 LAB — COMPLETE METABOLIC PANEL WITH GFR
ALBUMIN: 4.3 g/dL (ref 3.6–5.1)
ALK PHOS: 68 U/L (ref 33–130)
ALT: 12 U/L (ref 6–29)
AST: 19 U/L (ref 10–35)
BILIRUBIN TOTAL: 0.6 mg/dL (ref 0.2–1.2)
BUN: 15 mg/dL (ref 7–25)
CALCIUM: 9.4 mg/dL (ref 8.6–10.4)
CO2: 27 mmol/L (ref 20–31)
CREATININE: 1.12 mg/dL — AB (ref 0.50–1.05)
Chloride: 103 mmol/L (ref 98–110)
GFR, EST AFRICAN AMERICAN: 63 mL/min (ref >=60)
GFR, EST NON AFRICAN AMERICAN: 55 mL/min — AB (ref >=60)
Glucose, Bld: 94 mg/dL (ref 65–99)
Potassium: 4.3 mmol/L (ref 3.5–5.3)
Sodium: 138 mmol/L (ref 135–146)
TOTAL PROTEIN: 7.2 g/dL (ref 6.1–8.1)

## 2016-08-13 MED FILL — ESTRADIOL-NORETH 1.0-0.5 MG: 1-0.5 | 84 days supply | Qty: 84 | Fill #2

## 2016-08-20 DIAGNOSIS — L668 Other cicatricial alopecia: Secondary | ICD-10-CM | POA: Diagnosis not present

## 2016-08-20 DIAGNOSIS — D485 Neoplasm of uncertain behavior of skin: Secondary | ICD-10-CM | POA: Diagnosis not present

## 2016-08-20 DIAGNOSIS — D2339 Other benign neoplasm of skin of other parts of face: Secondary | ICD-10-CM | POA: Diagnosis not present

## 2016-08-20 DIAGNOSIS — L700 Acne vulgaris: Secondary | ICD-10-CM | POA: Diagnosis not present

## 2016-08-20 DIAGNOSIS — D225 Melanocytic nevi of trunk: Secondary | ICD-10-CM | POA: Diagnosis not present

## 2016-08-20 DIAGNOSIS — L309 Dermatitis, unspecified: Secondary | ICD-10-CM | POA: Diagnosis not present

## 2016-08-20 DIAGNOSIS — L648 Other androgenic alopecia: Secondary | ICD-10-CM | POA: Diagnosis not present

## 2016-08-26 MED FILL — METOPROLOL SUCC ER 25 MG TA: 25 | 90 days supply | Qty: 90 | Fill #2

## 2016-08-28 DIAGNOSIS — L989 Disorder of the skin and subcutaneous tissue, unspecified: Secondary | ICD-10-CM | POA: Diagnosis not present

## 2016-09-10 DIAGNOSIS — L668 Other cicatricial alopecia: Secondary | ICD-10-CM | POA: Diagnosis not present

## 2016-09-22 DIAGNOSIS — L659 Nonscarring hair loss, unspecified: Secondary | ICD-10-CM | POA: Diagnosis not present

## 2016-11-10 DIAGNOSIS — L820 Inflamed seborrheic keratosis: Secondary | ICD-10-CM | POA: Diagnosis not present

## 2016-11-10 DIAGNOSIS — D1801 Hemangioma of skin and subcutaneous tissue: Secondary | ICD-10-CM | POA: Diagnosis not present

## 2016-11-19 ENCOUNTER — Other Ambulatory Visit: Payer: Self-pay | Admitting: Family Medicine

## 2016-11-19 DIAGNOSIS — Z1231 Encounter for screening mammogram for malignant neoplasm of breast: Secondary | ICD-10-CM

## 2016-11-21 MED FILL — METOPROLOL SUCC ER 25 MG TA: 25 | 90 days supply | Qty: 90 | Fill #3

## 2016-12-01 MED FILL — ESTRADIOL-NORETH 1.0-0.5 MG: 1-0.5 | 84 days supply | Qty: 84 | Fill #3

## 2017-01-07 ENCOUNTER — Ambulatory Visit
Admission: RE | Admit: 2017-01-07 | Discharge: 2017-01-07 | Disposition: A | Discharge status: Home / Self Care | Payer: 59 | Source: Ambulatory Visit | Attending: Family Medicine | Admitting: Family Medicine

## 2017-01-07 DIAGNOSIS — Z1231 Encounter for screening mammogram for malignant neoplasm of breast: Secondary | ICD-10-CM | POA: Diagnosis not present

## 2017-01-22 DIAGNOSIS — L659 Nonscarring hair loss, unspecified: Secondary | ICD-10-CM | POA: Diagnosis not present

## 2017-01-22 DIAGNOSIS — L649 Androgenic alopecia, unspecified: Secondary | ICD-10-CM | POA: Diagnosis not present

## 2017-01-29 ENCOUNTER — Ambulatory Visit (INDEPENDENT_AMBULATORY_CARE_PROVIDER_SITE_OTHER): Payer: 59 | Admitting: Family Medicine

## 2017-01-29 ENCOUNTER — Encounter: Payer: Self-pay | Admitting: Family Medicine

## 2017-01-29 VITALS — BP 128/77 | HR 67 | Ht 68.0 in | Wt 126.0 lb

## 2017-01-29 DIAGNOSIS — Z0000 Encounter for general adult medical examination without abnormal findings: Secondary | ICD-10-CM

## 2017-01-29 DIAGNOSIS — L659 Nonscarring hair loss, unspecified: Secondary | ICD-10-CM | POA: Diagnosis not present

## 2017-01-29 DIAGNOSIS — Z23 Encounter for immunization: Secondary | ICD-10-CM

## 2017-01-29 LAB — CBC WITH DIFFERENTIAL/PLATELET
BASOS PCT: 1 %
Basophils Absolute: 44 cells/uL (ref 0–200)
EOS ABS: 44 {cells}/uL (ref 15–500)
Eosinophils Relative: 1 %
HEMATOCRIT: 39.2 % (ref 35.0–45.0)
Hemoglobin: 13 g/dL (ref 11.7–15.5)
Lymphocytes Relative: 49 %
Lymphs Abs: 2156 cells/uL (ref 850–3900)
MCH: 32.4 pg (ref 27.0–33.0)
MCHC: 33.2 g/dL (ref 32.0–36.0)
MCV: 97.8 fL (ref 80.0–100.0)
MONO ABS: 308 {cells}/uL (ref 200–950)
MPV: 10.4 fL (ref 7.5–12.5)
Monocytes Relative: 7 %
NEUTROS ABS: 1848 {cells}/uL (ref 1500–7800)
Neutrophils Relative %: 42 %
PLATELETS: 179 10*3/uL (ref 140–400)
RBC: 4.01 MIL/uL (ref 3.80–5.10)
RDW: 12.3 % (ref 11.0–15.0)
WBC: 4.4 10*3/uL (ref 3.8–10.8)

## 2017-01-29 LAB — COMPLETE METABOLIC PANEL WITH GFR
ALBUMIN: 4.6 g/dL (ref 3.6–5.1)
ALK PHOS: 62 U/L (ref 33–130)
ALT: 12 U/L (ref 6–29)
AST: 20 U/L (ref 10–35)
BUN: 15 mg/dL (ref 7–25)
CALCIUM: 9.5 mg/dL (ref 8.6–10.4)
CO2: 24 mmol/L (ref 20–31)
CREATININE: 1.07 mg/dL — AB (ref 0.50–1.05)
Chloride: 103 mmol/L (ref 98–110)
GFR, Est African American: 67 mL/min (ref >=60)
GFR, Est Non African American: 58 mL/min — ABNORMAL LOW (ref >=60)
GLUCOSE: 92 mg/dL (ref 65–99)
POTASSIUM: 4.1 mmol/L (ref 3.5–5.3)
SODIUM: 139 mmol/L (ref 135–146)
TOTAL PROTEIN: 7.5 g/dL (ref 6.1–8.1)
Total Bilirubin: 1.1 mg/dL (ref 0.2–1.2)

## 2017-01-29 LAB — LIPID PANEL
CHOLESTEROL: 181 mg/dL (ref <200)
HDL: 79 mg/dL (ref >50)
LDL Cholesterol: 87 mg/dL (ref <100)
Total CHOL/HDL Ratio: 2.3 Ratio (ref <5.0)
Triglycerides: 77 mg/dL (ref <150)
VLDL: 15 mg/dL (ref <30)

## 2017-01-29 LAB — TSH: TSH: 0.57 mIU/L

## 2017-01-29 NOTE — Patient Instructions (Addendum)
Recheck kidney function in 6 months. Can call for labwork.   Keep up a regular exercise program and make sure you are eating a healthy diet Try to eat 4 servings of dairy a day, or if you are lactose intolerant take a calcium with vitamin D daily.  Your vaccines are up to date.

## 2017-01-29 NOTE — Progress Notes (Signed)
Subjective:     Natalie David is a 56 y.o. female and is here for a comprehensive physical exam. The patient reports no problems.She did want to kidney on a couple of things. Her mother was diagnosed with Paget's disease of the nipple so we'll add this to her family history. She was diagnosed at age 89. She had a mastectomy. She has also been seen at [facility] dermatology and they have requested that we check a ferritin, zinc, TSH and CBC for hair loss.  Social History   Social History  . Marital status: Married    Spouse name: Denyce Harr  . Number of children: 1   . Years of education: N/A   Occupational History  . 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 Smoking status: Never Smoker  . Smokeless tobacco: Never Used  . Alcohol use 1.8 oz/week    3 Glasses of wine per week  . Drug use: No  . Sexual activity: Yes    Partners: Male    Birth control/ protection: Pill   Other Topics Concern  . Not on file   Social History Narrative   Some exercise, 3 days per week. Drinks diet soda one a day.     Health Maintenance  Topic Date Due  . INFLUENZA VACCINE  05/13/2017  . PAP SMEAR  12/20/2017  . MAMMOGRAM  01/08/2019  . TETANUS/TDAP  12/10/2021  . COLONOSCOPY  02/12/2023  . Hepatitis C Screening  Completed  . HIV Screening  Completed    The following portions of the patient's history were reviewed and updated as appropriate: allergies, current medications, past family history, past medical history, past social history, past surgical history and problem list.  Review of Systems A comprehensive review of systems was negative.   Objective:    BP 128/77   Pulse 67   Ht 5\' 8"  (1.727 m)   Wt 126 lb (57.2 kg)   LMP 12/16/2009   BMI 19.16 kg/m  General appearance: alert, cooperative and appears stated age Head: Normocephalic, without obvious abnormality, atraumatic Eyes: conj clear, EOMI, PEERLA Ears: normal TM's and external ear canals both  ears Nose: Nares normal. Septum midline. Mucosa normal. No drainage or sinus tenderness. Throat: lips, mucosa, and tongue normal; teeth and gums normal Neck: no adenopathy, no carotid bruit, no JVD, supple, symmetrical, trachea midline and thyroid not enlarged, symmetric, no tenderness/mass/nodules Back: symmetric, no curvature. ROM normal. No CVA tenderness. Lungs: clear to auscultation bilaterally Breasts: normal appearance, no masses or tenderness Heart: regular rate and rhythm, S1, S2 normal, no murmur, click, rub or gallop Abdomen: soft, non-tender; bowel sounds normal; no masses,  no organomegaly Extremities: extremities normal, atraumatic, no cyanosis or edema Pulses: 2+ and symmetric Skin: Skin color, texture, turgor normal. No rashes or lesions Lymph nodes: Cervical, supraclavicular, and axillary nodes normal. Neurologic: Alert and oriented X 3, normal strength and tone. Normal symmetric reflexes. Normal coordination and gait    Assessment:    Healthy female exam.      Plan:     See After Visit Summary for Counseling Recommendations   Keep up a regular exercise program and make sure you are eating a healthy diet Try to eat 4 servings of dairy a day, or if you are lactose intolerant take a calcium with vitamin D daily.  Your vaccines are up to date.   Hair loss-we'll do additional lab work and send a copy to [facility].

## 2017-01-30 LAB — FERRITIN: FERRITIN: 140 ng/mL (ref 10–232)

## 2017-02-01 LAB — ZINC: ZINC: 67 ug/dL (ref 60–130)

## 2017-02-18 ENCOUNTER — Other Ambulatory Visit: Payer: Self-pay | Admitting: Family Medicine

## 2017-02-18 DIAGNOSIS — R000 Tachycardia, unspecified: Secondary | ICD-10-CM

## 2017-02-18 MED FILL — METOPROLOL SUCC ER 25 MG TA: 25 | 90 days supply | Qty: 90 | Fill #0

## 2017-02-18 MED FILL — ESTRADIOL-NORETH 1.0-0.5 MG: 1-0.5 | 84 days supply | Qty: 84 | Fill #0

## 2017-03-10 MED FILL — FINASTERIDE 5 MG TABLET: 5 | 30 days supply | Qty: 30 | Fill #0

## 2017-03-10 MED FILL — SPIRONOLACTONE 50 MG TABLET: 50 | 30 days supply | Qty: 30 | Fill #0

## 2017-04-06 MED FILL — FINASTERIDE 5 MG TABLET: 5 | 30 days supply | Qty: 30 | Fill #1

## 2017-04-06 MED FILL — SPIRONOLACTONE 50 MG TAB: 50 | 30 days supply | Qty: 30 | Fill #1

## 2017-05-05 MED FILL — SPIRONOLACTONE 50 MG TABLET: 50 | 30 days supply | Qty: 30 | Fill #2

## 2017-05-05 MED FILL — FINASTERIDE 5 MG TABLET: 5 | 30 days supply | Qty: 30 | Fill #2

## 2017-05-19 MED FILL — ESTRADIOL-NORETH 1.0-0.5 MG: 1-0.5 | 84 days supply | Qty: 84 | Fill #1

## 2017-05-19 MED FILL — METOPROLOL SUCC ER 25 MG TA: 25 | 90 days supply | Qty: 90 | Fill #1

## 2017-06-01 ENCOUNTER — Ambulatory Visit (INDEPENDENT_AMBULATORY_CARE_PROVIDER_SITE_OTHER): Payer: 59 | Admitting: Family Medicine

## 2017-06-01 VITALS — BP 117/70 | HR 98 | Temp 98.1°F

## 2017-06-01 DIAGNOSIS — Z23 Encounter for immunization: Secondary | ICD-10-CM

## 2017-06-01 NOTE — Progress Notes (Signed)
Pt came into clinic today for Shingrix #2. Pt reports no negative side effects from first injection aside from muscle pain. Pt tolerated injection today in right deltoid well, no immediate complications. No further questions or concerns.

## 2017-06-01 NOTE — Progress Notes (Signed)
Agree with above.  Natalie Metheney, MD  

## 2017-06-02 MED FILL — SPIRONOLACTONE 50 MG TABLET: 50 | 30 days supply | Qty: 30 | Fill #3

## 2017-06-02 MED FILL — FINASTERIDE 5 MG TABLET: 5 | 30 days supply | Qty: 30 | Fill #3

## 2017-07-03 MED FILL — FINASTERIDE 5 MG TABLET: 5 | 30 days supply | Qty: 30 | Fill #4

## 2017-07-03 MED FILL — SPIRONOLACTONE 50 MG TAB: 50 | 30 days supply | Qty: 30 | Fill #4

## 2017-07-24 ENCOUNTER — Encounter: Payer: Self-pay | Admitting: Family Medicine

## 2017-07-24 DIAGNOSIS — N183 Chronic kidney disease, stage 3 unspecified: Secondary | ICD-10-CM

## 2017-07-27 MED FILL — FINASTERIDE 5 MG TABLET: 5 | 90 days supply | Qty: 90 | Fill #0

## 2017-07-27 MED FILL — SPIRONOLACTONE 50 MG TAB: 50 | 90 days supply | Qty: 90 | Fill #0

## 2017-07-28 DIAGNOSIS — N183 Chronic kidney disease, stage 3 (moderate): Secondary | ICD-10-CM | POA: Diagnosis not present

## 2017-07-29 LAB — BASIC METABOLIC PANEL WITH GFR
BUN / CREAT RATIO: 15 (calc) (ref 6–22)
BUN: 17 mg/dL (ref 7–25)
CO2: 27 mmol/L (ref 20–32)
Calcium: 9.4 mg/dL (ref 8.6–10.4)
Chloride: 101 mmol/L (ref 98–110)
Creat: 1.14 mg/dL — ABNORMAL HIGH (ref 0.50–1.05)
GFR, EST NON AFRICAN AMERICAN: 53 mL/min/{1.73_m2} — AB (ref >=60)
GFR, Est African American: 62 mL/min/{1.73_m2} (ref >=60)
Glucose, Bld: 93 mg/dL (ref 65–99)
POTASSIUM: 4 mmol/L (ref 3.5–5.3)
SODIUM: 138 mmol/L (ref 135–146)

## 2017-08-17 DIAGNOSIS — H527 Unspecified disorder of refraction: Secondary | ICD-10-CM | POA: Diagnosis not present

## 2017-08-24 MED FILL — ESTRADIOL-NORETH 1.0-0.5 MG: 1-0.5 | 84 days supply | Qty: 84 | Fill #2

## 2017-08-24 MED FILL — METOPROLOL SUCC ER 25 MG TA: 25 | 90 days supply | Qty: 90 | Fill #2

## 2017-10-27 MED FILL — SPIRONOLACTONE 50 MG TAB: 50 | 90 days supply | Qty: 90 | Fill #1

## 2017-10-27 MED FILL — FINASTERIDE 5 MG TABLET: 5 | 90 days supply | Qty: 90 | Fill #1

## 2017-11-17 MED FILL — METOPROLOL SUCCINATE ER 25: 25 | 90 days supply | Qty: 90 | Fill #3

## 2017-12-02 ENCOUNTER — Other Ambulatory Visit: Payer: Self-pay | Admitting: Family Medicine

## 2017-12-02 DIAGNOSIS — Z139 Encounter for screening, unspecified: Secondary | ICD-10-CM

## 2018-01-08 ENCOUNTER — Ambulatory Visit
Admission: RE | Admit: 2018-01-08 | Discharge: 2018-01-08 | Disposition: A | Discharge status: Home / Self Care | Payer: 59 | Source: Ambulatory Visit | Attending: Family Medicine | Admitting: Family Medicine

## 2018-01-08 DIAGNOSIS — Z1231 Encounter for screening mammogram for malignant neoplasm of breast: Secondary | ICD-10-CM | POA: Diagnosis not present

## 2018-01-08 DIAGNOSIS — Z139 Encounter for screening, unspecified: Secondary | ICD-10-CM

## 2018-01-21 MED FILL — SPIRONOLACTONE 50 MG TAB: 50 | 30 days supply | Qty: 30 | Fill #5

## 2018-01-21 MED FILL — FINASTERIDE 5 MG TABLET: 5 | 30 days supply | Qty: 30 | Fill #5

## 2018-02-03 ENCOUNTER — Encounter: Payer: Self-pay | Admitting: Family Medicine

## 2018-02-03 ENCOUNTER — Other Ambulatory Visit (HOSPITAL_COMMUNITY)
Admission: RE | Admit: 2018-02-03 | Discharge: 2018-02-03 | Disposition: A | Discharge status: Home / Self Care | Payer: 59 | Source: Ambulatory Visit | Attending: Family Medicine | Admitting: Family Medicine

## 2018-02-03 ENCOUNTER — Ambulatory Visit (INDEPENDENT_AMBULATORY_CARE_PROVIDER_SITE_OTHER): Payer: 59 | Admitting: Family Medicine

## 2018-02-03 VITALS — BP 90/57 | HR 76 | Ht 68.0 in | Wt 127.0 lb

## 2018-02-03 DIAGNOSIS — Z0000 Encounter for general adult medical examination without abnormal findings: Secondary | ICD-10-CM

## 2018-02-03 DIAGNOSIS — L658 Other specified nonscarring hair loss: Secondary | ICD-10-CM | POA: Diagnosis not present

## 2018-02-03 DIAGNOSIS — N183 Chronic kidney disease, stage 3 (moderate): Secondary | ICD-10-CM

## 2018-02-03 DIAGNOSIS — Z124 Encounter for screening for malignant neoplasm of cervix: Secondary | ICD-10-CM | POA: Insufficient documentation

## 2018-02-03 DIAGNOSIS — R87810 Cervical high risk human papillomavirus (HPV) DNA test positive: Secondary | ICD-10-CM | POA: Insufficient documentation

## 2018-02-03 DIAGNOSIS — R000 Tachycardia, unspecified: Secondary | ICD-10-CM

## 2018-02-03 DIAGNOSIS — N1831 Chronic kidney disease, stage 3a: Secondary | ICD-10-CM

## 2018-02-03 LAB — CBC
HEMATOCRIT: 36.1 % (ref 35.0–45.0)
HEMOGLOBIN: 12.5 g/dL (ref 11.7–15.5)
MCH: 33 pg (ref 27.0–33.0)
MCHC: 34.6 g/dL (ref 32.0–36.0)
MCV: 95.3 fL (ref 80.0–100.0)
MPV: 10.4 fL (ref 7.5–12.5)
Platelets: 159 10*3/uL (ref 140–400)
RBC: 3.79 10*6/uL — ABNORMAL LOW (ref 3.80–5.10)
RDW: 11.4 % (ref 11.0–15.0)
WBC: 4 10*3/uL (ref 3.8–10.8)

## 2018-02-03 LAB — POCT UA - MICROALBUMIN
Albumin/Creatinine Ratio, Urine, POC: <30
CREATININE, POC: 100 mg/dL
Microalbumin Ur, POC: 10 mg/L

## 2018-02-03 MED ORDER — ESTRADIOL-NORETHINDRONE ACET 1-0.5 MG PO TABS: 1.0000 | ORAL_TABLET | Freq: Every day | ORAL | 3 refills | Status: DC

## 2018-02-03 MED ORDER — METOPROLOL SUCCINATE ER 25 MG PO TB24: 25.0000 mg | ORAL_TABLET | Freq: Every day | ORAL | 3 refills | Status: DC

## 2018-02-03 MED FILL — METOPROLOL SUCCINATE ER 25: 25 | 90 days supply | Qty: 90 | Fill #0

## 2018-02-03 MED FILL — ESTRADIOL-NORETH 1.0-0.5 MG: 1-0.5 | 84 days supply | Qty: 84 | Fill #0

## 2018-02-03 NOTE — Patient Instructions (Signed)

## 2018-02-03 NOTE — Progress Notes (Addendum)
Subjective:     Natalie David is a 57 y.o. female and is here for a comprehensive physical exam. The patient reports no problems.  Social History   Socioeconomic History  . Marital status: Married    Spouse name: Mayela Bullard  . Number of children: 1   . Years of education: Not on file  . Highest education level: Not on file  Occupational History  . Occupation: Programmer, multimedia: [employer]  Social Needs  . Financial resource strain: Not on file  . Food insecurity:    Worry: Not on file    Inability: Not on file  . Transportation needs:    Medical: Not on file    Non-medical: Not on file  Tobacco Use  . Smoking status: Never Smoker  . Smokeless tobacco: Never Used  Substance and Sexual Activity  . Alcohol use: Yes    Alcohol/week: 1.8 oz    Types: 3 Glasses of wine per week  . Drug use: No  . Sexual activity: Yes    Partners: Male    Birth control/protection: Pill  Lifestyle  . Physical activity:    Days per week: Not on file    Minutes per session: Not on file  . Stress: Not on file  Relationships  . Social connections:    Talks on phone: Not on file    Gets together: Not on file    Attends religious service: Not on file    Active member of club or organization: Not on file    Attends meetings of clubs or organizations: Not on file    Relationship status: Not on file  . Intimate partner violence:    Fear of current or ex partner: Not on file    Emotionally abused: Not on file    Physically abused: Not on file    Forced sexual activity: Not on file  Other Topics Concern  . Not on file  Social History Narrative   Some exercise, 3 days per week. Drinks diet soda one a day.     Health Maintenance  Topic Date Due  . PAP SMEAR  12/20/2017  . INFLUENZA VACCINE  05/13/2018  . MAMMOGRAM  01/09/2020  . TETANUS/TDAP  12/10/2021  . COLONOSCOPY  02/12/2023  . Hepatitis C Screening  Completed  . HIV Screening  Completed    The  following portions of the patient's history were reviewed and updated as appropriate: allergies, current medications, past family history, past medical history, past social history, past surgical history and problem list.  Review of Systems A comprehensive review of systems was negative.   Objective:    BP (!) 90/57   Pulse 76   Ht 5\' 8"  (1.727 m)   Wt 127 lb (57.6 kg)   LMP 12/16/2009   SpO2 100%   BMI 19.31 kg/m  General appearance: alert, cooperative and appears stated age Head: Normocephalic, without obvious abnormality, atraumatic Eyes: conj clear, EOMI, PEERLA Ears: normal TM's and external ear canals both ears Nose: Nares normal. Septum midline. Mucosa normal. No drainage or sinus tenderness. Throat: lips, mucosa, and tongue normal; teeth and gums normal Neck: no adenopathy, no carotid bruit, no JVD, supple, symmetrical, trachea midline and thyroid not enlarged, symmetric, no tenderness/mass/nodules Back: symmetric, no curvature. ROM normal. No CVA tenderness. Lungs: clear to auscultation bilaterally Breasts: normal appearance, no masses or tenderness, bilateral implants.  Heart: regular rate and rhythm, S1, S2 normal, no murmur, click,  rub or gallop Abdomen: soft, non-tender; bowel sounds normal; no masses,  no organomegaly Pelvic: cervix normal in appearance, external genitalia normal, no adnexal masses or tenderness, no cervical motion tenderness, rectovaginal septum normal, uterus normal size, shape, and consistency and vagina normal without discharge Extremities: extremities normal, atraumatic, no cyanosis or edema Pulses: 2+ and symmetric Skin: Skin color, texture, turgor normal. No rashes or lesions Lymph nodes: Cervical, supraclavicular, and axillary nodes normal. Neurologic: Alert and oriented X 3, normal strength and tone. Normal symmetric reflexes. Normal coordination and gait    Assessment:    Healthy female exam.      Plan:     See After Visit Summary for  Counseling Recommendations   Keep up a regular exercise program and make sure you are eating a healthy diet Try to eat 4 servings of dairy a day, or if you are lactose intolerant take a calcium with vitamin D daily.  Your vaccines are up to date. Dsicssed new shingles vaccine.   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  Negative microalbumin.    Hair loss  Now on fiasteride and spironolactone and rogaine with Dr. Daryll Drown at [facility].

## 2018-02-04 DIAGNOSIS — L658 Other specified nonscarring hair loss: Secondary | ICD-10-CM | POA: Insufficient documentation

## 2018-02-04 LAB — COMPLETE METABOLIC PANEL WITH GFR
AG Ratio: 1.5 (calc) (ref 1.0–2.5)
ALT: 11 U/L (ref 6–29)
AST: 18 U/L (ref 10–35)
Albumin: 4.6 g/dL (ref 3.6–5.1)
Alkaline phosphatase (APISO): 66 U/L (ref 33–130)
BUN/Creatinine Ratio: 13 (calc) (ref 6–22)
BUN: 16 mg/dL (ref 7–25)
CALCIUM: 9.7 mg/dL (ref 8.6–10.4)
CHLORIDE: 104 mmol/L (ref 98–110)
CO2: 29 mmol/L (ref 20–32)
CREATININE: 1.2 mg/dL — AB (ref 0.50–1.05)
GFR, EST AFRICAN AMERICAN: 58 mL/min/{1.73_m2} — AB (ref >=60)
GFR, Est Non African American: 50 mL/min/{1.73_m2} — ABNORMAL LOW (ref >=60)
GLUCOSE: 96 mg/dL (ref 65–99)
Globulin: 3 g/dL (calc) (ref 1.9–3.7)
Potassium: 4.2 mmol/L (ref 3.5–5.3)
Sodium: 140 mmol/L (ref 135–146)
TOTAL PROTEIN: 7.6 g/dL (ref 6.1–8.1)
Total Bilirubin: 0.9 mg/dL (ref 0.2–1.2)

## 2018-02-04 LAB — LIPID PANEL
Cholesterol: 187 mg/dL (ref <200)
HDL: 76 mg/dL (ref >50)
LDL Cholesterol (Calc): 96 mg/dL (calc)
NON-HDL CHOLESTEROL (CALC): 111 mg/dL (ref <130)
Total CHOL/HDL Ratio: 2.5 (calc) (ref <5.0)
Triglycerides: 60 mg/dL (ref <150)

## 2018-02-04 LAB — TSH: TSH: 0.65 mIU/L (ref 0.40–4.50)

## 2018-02-08 LAB — CYTOLOGY - PAP
ADEQUACY: ABSENT
Diagnosis: NEGATIVE
HPV (WINDOPATH): DETECTED — AB
HPV 16/18/45 GENOTYPING: NEGATIVE

## 2018-03-01 ENCOUNTER — Encounter: Payer: Self-pay | Admitting: Family Medicine

## 2018-03-03 MED FILL — SPIRONOLACTONE 50 MG TAB: 50 | 90 days supply | Qty: 90 | Fill #0

## 2018-03-03 MED FILL — FINASTERIDE 5 MG TABLET: 5 | 90 days supply | Qty: 90 | Fill #0

## 2018-03-11 DIAGNOSIS — L658 Other specified nonscarring hair loss: Secondary | ICD-10-CM | POA: Diagnosis not present

## 2018-04-30 MED FILL — METOPROLOL SUCCINATE ER 25: 25 | 90 days supply | Qty: 90 | Fill #1

## 2018-05-03 ENCOUNTER — Encounter: Payer: Self-pay | Admitting: Family Medicine

## 2018-05-03 DIAGNOSIS — N183 Chronic kidney disease, stage 3 (moderate): Principal | ICD-10-CM

## 2018-05-03 DIAGNOSIS — N1831 Chronic kidney disease, stage 3a: Secondary | ICD-10-CM

## 2018-05-05 DIAGNOSIS — N183 Chronic kidney disease, stage 3 (moderate): Secondary | ICD-10-CM | POA: Diagnosis not present

## 2018-05-06 LAB — COMPLETE METABOLIC PANEL WITH GFR
AG Ratio: 1.8 (calc) (ref 1.0–2.5)
ALBUMIN MSPROF: 4.7 g/dL (ref 3.6–5.1)
ALKALINE PHOSPHATASE (APISO): 61 U/L (ref 33–130)
ALT: 8 U/L (ref 6–29)
AST: 17 U/L (ref 10–35)
BUN / CREAT RATIO: 16 (calc) (ref 6–22)
BUN: 20 mg/dL (ref 7–25)
CO2: 29 mmol/L (ref 20–32)
CREATININE: 1.23 mg/dL — AB (ref 0.50–1.05)
Calcium: 9.3 mg/dL (ref 8.6–10.4)
Chloride: 102 mmol/L (ref 98–110)
GFR, Est African American: 56 mL/min/{1.73_m2} — ABNORMAL LOW (ref >=60)
GFR, Est Non African American: 48 mL/min/{1.73_m2} — ABNORMAL LOW (ref >=60)
GLUCOSE: 98 mg/dL (ref 65–99)
Globulin: 2.6 g/dL (calc) (ref 1.9–3.7)
Potassium: 3.9 mmol/L (ref 3.5–5.3)
Sodium: 137 mmol/L (ref 135–146)
Total Bilirubin: 0.7 mg/dL (ref 0.2–1.2)
Total Protein: 7.3 g/dL (ref 6.1–8.1)

## 2018-05-06 LAB — MICROALBUMIN / CREATININE URINE RATIO
CREATININE, URINE: 32 mg/dL (ref 20–275)
MICROALB UR: 0.2 mg/dL
Microalb Creat Ratio: 6 mcg/mg creat (ref <30)

## 2018-05-06 LAB — PTH, INTACT AND CALCIUM
CALCIUM: 9.3 mg/dL (ref 8.6–10.4)
PTH: 31 pg/mL (ref 14–64)

## 2018-05-06 LAB — PHOSPHORUS: PHOSPHORUS: 3.4 mg/dL (ref 2.5–4.5)

## 2018-05-07 NOTE — Addendum Note (Signed)
Addended by: [name] D on: 05/07/2018 09:22 AM   Modules accepted: Orders

## 2018-05-14 ENCOUNTER — Ambulatory Visit (INDEPENDENT_AMBULATORY_CARE_PROVIDER_SITE_OTHER): Payer: 59

## 2018-05-14 DIAGNOSIS — N183 Chronic kidney disease, stage 3 (moderate): Secondary | ICD-10-CM

## 2018-05-14 DIAGNOSIS — N1831 Chronic kidney disease, stage 3a: Secondary | ICD-10-CM

## 2018-05-31 MED FILL — FINASTERIDE 5 MG TABLET: 5 | 90 days supply | Qty: 90 | Fill #1

## 2018-08-16 MED FILL — METOPROLOL SUCCINATE ER 25: 25 | 90 days supply | Qty: 90 | Fill #2

## 2018-08-22 ENCOUNTER — Encounter: Payer: Self-pay | Admitting: Family Medicine

## 2018-08-22 DIAGNOSIS — N1831 Chronic kidney disease, stage 3a: Secondary | ICD-10-CM

## 2018-08-22 DIAGNOSIS — N183 Chronic kidney disease, stage 3 (moderate): Principal | ICD-10-CM

## 2018-08-23 ENCOUNTER — Other Ambulatory Visit: Payer: Self-pay | Admitting: *Deleted

## 2018-08-23 DIAGNOSIS — N1831 Chronic kidney disease, stage 3a: Secondary | ICD-10-CM

## 2018-08-23 DIAGNOSIS — N183 Chronic kidney disease, stage 3 (moderate): Principal | ICD-10-CM

## 2018-08-24 DIAGNOSIS — N183 Chronic kidney disease, stage 3 (moderate): Secondary | ICD-10-CM | POA: Diagnosis not present

## 2018-08-24 LAB — BASIC METABOLIC PANEL WITH GFR
BUN: 18 mg/dL (ref 7–25)
CO2: 29 mmol/L (ref 20–32)
CREATININE: 0.93 mg/dL (ref 0.50–1.05)
Calcium: 9.5 mg/dL (ref 8.6–10.4)
Chloride: 101 mmol/L (ref 98–110)
GFR, EST AFRICAN AMERICAN: 79 mL/min/{1.73_m2} (ref >=60)
GFR, EST NON AFRICAN AMERICAN: 68 mL/min/{1.73_m2} (ref >=60)
Glucose, Bld: 94 mg/dL (ref 65–99)
Potassium: 4.4 mmol/L (ref 3.5–5.3)
Sodium: 137 mmol/L (ref 135–146)

## 2018-08-25 ENCOUNTER — Encounter: Payer: Self-pay | Admitting: Family Medicine

## 2018-08-25 MED FILL — FINASTERIDE 5 MG TABLET: 5 | 90 days supply | Qty: 90 | Fill #0

## 2018-09-16 DIAGNOSIS — L658 Other specified nonscarring hair loss: Secondary | ICD-10-CM | POA: Diagnosis not present

## 2018-11-19 MED FILL — METOPROLOL SUCCINATE ER 25: 25 | 90 days supply | Qty: 90 | Fill #3

## 2018-11-19 MED FILL — FINASTERIDE 5 MG TABLET: 5 | 90 days supply | Qty: 90 | Fill #1

## 2018-11-30 ENCOUNTER — Other Ambulatory Visit: Payer: Self-pay | Admitting: Family Medicine

## 2018-11-30 DIAGNOSIS — Z1231 Encounter for screening mammogram for malignant neoplasm of breast: Secondary | ICD-10-CM

## 2018-12-28 DIAGNOSIS — H524 Presbyopia: Secondary | ICD-10-CM | POA: Diagnosis not present

## 2019-01-10 ENCOUNTER — Ambulatory Visit: Payer: 59

## 2019-02-05 ENCOUNTER — Encounter: Payer: Self-pay | Admitting: Family Medicine

## 2019-02-07 ENCOUNTER — Other Ambulatory Visit: Payer: Self-pay

## 2019-02-07 DIAGNOSIS — Z0000 Encounter for general adult medical examination without abnormal findings: Secondary | ICD-10-CM

## 2019-02-07 DIAGNOSIS — R000 Tachycardia, unspecified: Secondary | ICD-10-CM

## 2019-02-07 MED ORDER — METOPROLOL SUCCINATE ER 25 MG PO TB24: 25.0000 mg | ORAL_TABLET | Freq: Every day | ORAL | 3 refills | Status: DC

## 2019-02-07 MED FILL — FINASTERIDE 5 MG TABLET: 5 | 90 days supply | Qty: 90 | Fill #0

## 2019-02-07 MED FILL — METOPROLOL SUCCINATE ER 25: 25 | 90 days supply | Qty: 90 | Fill #0

## 2019-02-07 NOTE — Telephone Encounter (Signed)
Patient scheduled and medication refilled.

## 2019-02-09 NOTE — Progress Notes (Signed)
Virtual Visit via Video Note  I connected with Natalie David on 02/09/19 at  8:10 AM EDT by a video enabled telemedicine application and verified that I am speaking with the correct person using two identifiers.   I discussed the limitations of evaluation and management by telemedicine and the availability of in person appointments. The patient expressed understanding and agreed to proceed.  Subjective:    CC: F/U BP   HPI:  Tachycardia - doing well on Betablocker. Feels like it  Is controlling her rate. Has been walking for exercise.  He does feel like metoprolol makes her little bit tired at times.  Hair loss- she stopped her spironolactone bc of her elevated creatinine.  No changes to her medication regimen.    She also had some questions about her Pap smear.  The last 1 showed she was positive for high risk HPV but with the additional testing for 16 1845 it was negative.  Mentation was to repeat Pap smear in 1 year.   Past medical history, Surgical history, Family history not pertinant except as noted below, Social history, Allergies, and medications have been entered into the medical record, reviewed, and corrections made.   Review of Systems: No fevers, chills, night sweats, weight loss, chest pain, or shortness of breath.   Objective:    General: Speaking clearly in complete sentences without any shortness of breath.  Alert and oriented x3.  Normal judgment. No apparent acute distress.    Impression and Recommendations:    Tachycardia - she is doing well. We did disuss potentially switching her to Bystolic at some point if she would like we could always try it for a month and see if she notices less fatigue than she does with metoprolol.  Hair loss - she is on proscar and minoxidil.  Doing much better off the spironolactone.  Renal impairment-her kidney function improved significantly after she came off the spironolactone.  She has low blood pressure anyway so as per  SPECT it was probably from decreased renal perfusion.  Abnormal Pap smear-due to repeat next month she Artie has it on the schedule.      I discussed the assessment and treatment plan with the patient. The patient was provided an opportunity to ask questions and all were answered. The patient agreed with the plan and demonstrated an understanding of the instructions.   The patient was advised to call back or seek an in-person evaluation if the symptoms worsen or if the condition fails to improve as anticipated.   Natalie Lecher, MD

## 2019-02-10 ENCOUNTER — Ambulatory Visit (INDEPENDENT_AMBULATORY_CARE_PROVIDER_SITE_OTHER): Payer: 59 | Admitting: Family Medicine

## 2019-02-10 ENCOUNTER — Encounter: Payer: Self-pay | Admitting: Family Medicine

## 2019-02-10 ENCOUNTER — Encounter: Payer: 59 | Admitting: Family Medicine

## 2019-02-10 VITALS — BP 92/70 | HR 78 | Ht 68.0 in | Wt 131.0 lb

## 2019-02-10 DIAGNOSIS — N183 Chronic kidney disease, stage 3 unspecified: Secondary | ICD-10-CM

## 2019-02-10 DIAGNOSIS — R000 Tachycardia, unspecified: Secondary | ICD-10-CM

## 2019-02-10 DIAGNOSIS — Z13220 Encounter for screening for lipoid disorders: Secondary | ICD-10-CM | POA: Diagnosis not present

## 2019-02-10 DIAGNOSIS — L658 Other specified nonscarring hair loss: Secondary | ICD-10-CM | POA: Diagnosis not present

## 2019-02-11 LAB — LIPID PANEL
Cholesterol: 194 mg/dL (ref <200)
HDL: 78 mg/dL (ref >=50)
LDL Cholesterol (Calc): 100 mg/dL (calc) — ABNORMAL HIGH
Non-HDL Cholesterol (Calc): 116 mg/dL (calc) (ref <130)
Total CHOL/HDL Ratio: 2.5 (calc) (ref <5.0)
Triglycerides: 72 mg/dL (ref <150)

## 2019-02-11 LAB — COMPLETE METABOLIC PANEL WITH GFR
AG Ratio: 1.6 (calc) (ref 1.0–2.5)
ALT: 18 U/L (ref 6–29)
AST: 25 U/L (ref 10–35)
Albumin: 4.7 g/dL (ref 3.6–5.1)
Alkaline phosphatase (APISO): 80 U/L (ref 37–153)
BUN/Creatinine Ratio: 12 (calc) (ref 6–22)
BUN: 13 mg/dL (ref 7–25)
CO2: 27 mmol/L (ref 20–32)
Calcium: 10.1 mg/dL (ref 8.6–10.4)
Chloride: 102 mmol/L (ref 98–110)
Creat: 1.07 mg/dL — ABNORMAL HIGH (ref 0.50–1.05)
GFR, Est African American: 66 mL/min/{1.73_m2} (ref >=60)
GFR, Est Non African American: 57 mL/min/{1.73_m2} — ABNORMAL LOW (ref >=60)
Globulin: 2.9 g/dL (calc) (ref 1.9–3.7)
Glucose, Bld: 101 mg/dL — ABNORMAL HIGH (ref 65–99)
Potassium: 4.6 mmol/L (ref 3.5–5.3)
Sodium: 139 mmol/L (ref 135–146)
Total Bilirubin: 0.9 mg/dL (ref 0.2–1.2)
Total Protein: 7.6 g/dL (ref 6.1–8.1)

## 2019-02-11 LAB — CBC
HCT: 36.9 % (ref 35.0–45.0)
Hemoglobin: 12.7 g/dL (ref 11.7–15.5)
MCH: 33 pg (ref 27.0–33.0)
MCHC: 34.4 g/dL (ref 32.0–36.0)
MCV: 95.8 fL (ref 80.0–100.0)
MPV: 11.1 fL (ref 7.5–12.5)
Platelets: 173 10*3/uL (ref 140–400)
RBC: 3.85 10*6/uL (ref 3.80–5.10)
RDW: 11.8 % (ref 11.0–15.0)
WBC: 4.2 10*3/uL (ref 3.8–10.8)

## 2019-02-14 ENCOUNTER — Ambulatory Visit: Payer: 59

## 2019-03-10 ENCOUNTER — Encounter: Payer: 59 | Admitting: Family Medicine

## 2019-03-16 ENCOUNTER — Other Ambulatory Visit (HOSPITAL_COMMUNITY)
Admission: RE | Admit: 2019-03-16 | Discharge: 2019-03-16 | Disposition: A | Discharge status: Home / Self Care | Payer: 59 | Source: Ambulatory Visit | Attending: Family Medicine | Admitting: Family Medicine

## 2019-03-16 ENCOUNTER — Ambulatory Visit (INDEPENDENT_AMBULATORY_CARE_PROVIDER_SITE_OTHER): Payer: 59 | Admitting: Family Medicine

## 2019-03-16 ENCOUNTER — Encounter: Payer: Self-pay | Admitting: Family Medicine

## 2019-03-16 VITALS — BP 94/59 | HR 86 | Ht 68.0 in | Wt 123.0 lb

## 2019-03-16 DIAGNOSIS — Z124 Encounter for screening for malignant neoplasm of cervix: Secondary | ICD-10-CM

## 2019-03-16 DIAGNOSIS — R7309 Other abnormal glucose: Secondary | ICD-10-CM | POA: Diagnosis not present

## 2019-03-16 DIAGNOSIS — N1831 Chronic kidney disease, stage 3a: Secondary | ICD-10-CM

## 2019-03-16 DIAGNOSIS — Z0000 Encounter for general adult medical examination without abnormal findings: Secondary | ICD-10-CM | POA: Diagnosis not present

## 2019-03-16 DIAGNOSIS — N183 Chronic kidney disease, stage 3 (moderate): Secondary | ICD-10-CM

## 2019-03-16 DIAGNOSIS — Z23 Encounter for immunization: Secondary | ICD-10-CM | POA: Diagnosis not present

## 2019-03-16 LAB — POCT GLYCOSYLATED HEMOGLOBIN (HGB A1C): Hemoglobin A1C: 5.2 % (ref 4.0–5.6)

## 2019-03-16 LAB — POCT UA - MICROALBUMIN
Albumin/Creatinine Ratio, Urine, POC: <30
Creatinine, POC: 200 mg/dL
Microalbumin Ur, POC: 10 mg/L

## 2019-03-16 NOTE — Progress Notes (Signed)
Subjective:     Natalie David is a 58 y.o. female and is here for a comprehensive physical exam. The patient reports no problems.  She had her lab work done in April and overall everything looked good.  She says she has been really strict about cutting back on her carbs recently because she checked her blood sugar and it was 101 and she was quite concerned and did not want to develop diabetes.  So in that process she has actually lost a lot of weight.  Social History   Socioeconomic History  . Marital status: Married    Spouse name: Vinita Prentiss  . Number of children: 1   . Years of education: Not on file  . Highest education level: Not on file  Occupational History  . Occupation: Programmer, multimedia: [employer]  Social Needs  . Financial resource strain: Not on file  . Food insecurity:    Worry: Not on file    Inability: Not on file  . Transportation needs:    Medical: Not on file    Non-medical: Not on file  Tobacco Use  . Smoking status: Never Smoker  . Smokeless tobacco: Never Used  Substance and Sexual Activity  . Alcohol use: Yes    Alcohol/week: 3.0 standard drinks    Types: 3 Glasses of wine per week  . Drug use: No  . Sexual activity: Yes    Partners: Male    Birth control/protection: Pill  Lifestyle  . Physical activity:    Days per week: Not on file    Minutes per session: Not on file  . Stress: Not on file  Relationships  . Social connections:    Talks on phone: Not on file    Gets together: Not on file    Attends religious service: Not on file    Active member of club or organization: Not on file    Attends meetings of clubs or organizations: Not on file    Relationship status: Not on file  . Intimate partner violence:    Fear of current or ex partner: Not on file    Emotionally abused: Not on file    Physically abused: Not on file    Forced sexual activity: Not on file  Other Topics Concern  . Not on file  Social History  Narrative   Some exercise, 3 days per week. Drinks diet soda one a day.     Health Maintenance  Topic Date Due  . INFLUENZA VACCINE  05/14/2019  . MAMMOGRAM  01/09/2020  . PAP SMEAR-Modifier  02/03/2021  . TETANUS/TDAP  12/10/2021  . COLONOSCOPY  02/12/2023  . Hepatitis C Screening  Completed  . HIV Screening  Completed    The following portions of the patient's history were reviewed and updated as appropriate: allergies, current medications, past family history, past medical history, past social history, past surgical history and problem list.  Review of Systems A comprehensive review of systems was negative.   Objective:    BP (!) 94/59   Pulse 86   Ht 5\' 8"  (1.727 m)   Wt 123 lb (55.8 kg)   LMP 12/16/2009   SpO2 100%   BMI 18.70 kg/m  General appearance: alert, cooperative and appears stated age Head: Normocephalic, without obvious abnormality, atraumatic Eyes: conj clear, EOMI, PEERLA Ears: normal TM's and external ear canals both ears Nose: Nares normal. Septum midline. Mucosa normal. No drainage or sinus  tenderness. Throat: lips, mucosa, and tongue normal; teeth and gums normal Neck: no adenopathy, no carotid bruit, no JVD, supple, symmetrical, trachea midline and thyroid not enlarged, symmetric, no tenderness/mass/nodules Back: symmetric, no curvature. ROM normal. No CVA tenderness. Lungs: clear to auscultation bilaterally Breasts: normal appearance, no masses or tenderness, bilatearl implants. Heart: regular rate and rhythm, S1, S2 normal, no murmur, click, rub or gallop Abdomen: soft, non-tender; bowel sounds normal; no masses,  no organomegaly Pelvic: cervix normal in appearance, external genitalia normal, no adnexal masses or tenderness, no cervical motion tenderness, rectovaginal septum normal, uterus normal size, shape, and consistency and vagina normal without discharge Extremities: extremities normal, atraumatic, no cyanosis or edema Pulses: 2+ and  symmetric Skin: Skin color, texture, turgor normal. No rashes or lesions Lymph nodes: Cervical, supraclavicular, and axillary nodes normal. Neurologic: Alert and oriented X 3, normal strength and tone. Normal symmetric reflexes. Normal coordination and gait    Assessment:    Healthy female exam.      Plan:     See After Visit Summary for Counseling Recommendations    Keep up a regular exercise program and make sure you are eating a healthy diet Try to eat 4 servings of dairy a day, or if you are lactose intolerant take a calcium with vitamin D daily.  Your vaccines are up to date.   Hemoglobin A1c was completely normal today.  So gave her reassurance that she can certainly eat in moderation carbs and sweets.  She does not need to be super strict about this.  Tdap given today.

## 2019-03-16 NOTE — Patient Instructions (Addendum)
6 month recheck on kidney function.      Preventive Care 40-64 Years, Female Preventive care refers to lifestyle choices and visits with your health care provider that can promote health and wellness. What does preventive care include?   A yearly physical exam. This is also called an annual well check.  Dental exams once or twice a year.  Routine eye exams. Ask your health care provider how often you should have your eyes checked.  Personal lifestyle choices, including: ? Daily care of your teeth and gums. ? Regular physical activity. ? Eating a healthy diet. ? Avoiding tobacco and drug use. ? Limiting alcohol use. ? Practicing safe sex. ? Taking low-dose aspirin daily starting at age 24. ? Taking vitamin and mineral supplements as recommended by your health care provider. What happens during an annual well check? The services and screenings done by your health care provider during your annual well check will depend on your age, overall health, lifestyle risk factors, and family history of disease. Counseling Your health care provider may ask you questions about your:  Alcohol use.  Tobacco use.  Drug use.  Emotional well-being.  Home and relationship well-being.  Sexual activity.  Eating habits.  Work and work Statistician.  Method of birth control.  Menstrual cycle.  Pregnancy history. Screening You may have the following tests or measurements:  Height, weight, and BMI.  Blood pressure.  Lipid and cholesterol levels. These may be checked every 5 years, or more frequently if you are over 24 years old.  Skin check.  Lung cancer screening. You may have this screening every year starting at age 41 if you have a 30-pack-year history of smoking and currently smoke or have quit within the past 15 years.  Colorectal cancer screening. All adults should have this screening starting at age 5 and continuing until age 40. Your health care provider may recommend  screening at age 29. You will have tests every 1-10 years, depending on your results and the type of screening test. People at increased risk should start screening at an earlier age. Screening tests may include: ? Guaiac-based fecal occult blood testing. ? Fecal immunochemical test (FIT). ? Stool DNA test. ? Virtual colonoscopy. ? Sigmoidoscopy. During this test, a flexible tube with a tiny camera (sigmoidoscope) is used to examine your rectum and lower colon. The sigmoidoscope is inserted through your anus into your rectum and lower colon. ? Colonoscopy. During this test, a long, thin, flexible tube with a tiny camera (colonoscope) is used to examine your entire colon and rectum.  Hepatitis C blood test.  Hepatitis B blood test.  Sexually transmitted disease (STD) testing.  Diabetes screening. This is done by checking your blood sugar (glucose) after you have not eaten for a while (fasting). You may have this done every 1-3 years.  Mammogram. This may be done every 1-2 years. Talk to your health care provider about when you should start having regular mammograms. This may depend on whether you have a family history of breast cancer.  BRCA-related cancer screening. This may be done if you have a family history of breast, ovarian, tubal, or peritoneal cancers.  Pelvic exam and Pap test. This may be done every 3 years starting at age 67. Starting at age 12, this may be done every 5 years if you have a Pap test in combination with an HPV test.  Bone density scan. This is done to screen for osteoporosis. You may have this scan if you are  at high risk for osteoporosis. Discuss your test results, treatment options, and if necessary, the need for more tests with your health care provider. Vaccines Your health care provider may recommend certain vaccines, such as:  Influenza vaccine. This is recommended every year.  Tetanus, diphtheria, and acellular pertussis (Tdap, Td) vaccine. You may need a  Td booster every 10 years.  Varicella vaccine. You may need this if you have not been vaccinated.  Zoster vaccine. You may need this after age 72.  Measles, mumps, and rubella (MMR) vaccine. You may need at least one dose of MMR if you were born in 1957 or later. You may also need a second dose.  Pneumococcal 13-valent conjugate (PCV13) vaccine. You may need this if you have certain conditions and were not previously vaccinated.  Pneumococcal polysaccharide (PPSV23) vaccine. You may need one or two doses if you smoke cigarettes or if you have certain conditions.  Meningococcal vaccine. You may need this if you have certain conditions.  Hepatitis A vaccine. You may need this if you have certain conditions or if you travel or work in places where you may be exposed to hepatitis A.  Hepatitis B vaccine. You may need this if you have certain conditions or if you travel or work in places where you may be exposed to hepatitis B.  Haemophilus influenzae type b (Hib) vaccine. You may need this if you have certain conditions. Talk to your health care provider about which screenings and vaccines you need and how often you need them. This information is not intended to replace advice given to you by your health care provider. Make sure you discuss any questions you have with your health care provider. Document Released: 10/26/2015 Document Revised: 11/19/2017 Document Reviewed: 07/31/2015 Elsevier Interactive Patient Education  2019 [employer].

## 2019-03-18 LAB — CYTOLOGY - PAP
Diagnosis: NEGATIVE
HPV: NOT DETECTED

## 2019-03-20 NOTE — Progress Notes (Signed)
Call patient: Your Pap smear is normal. Repeat in 3-5 years.

## 2019-03-24 DIAGNOSIS — L658 Other specified nonscarring hair loss: Secondary | ICD-10-CM | POA: Diagnosis not present

## 2019-03-24 MED FILL — KETOCONAZOLE 2% SHAMPOO: 2 | 30 days supply | Qty: 120 | Fill #0

## 2019-04-04 ENCOUNTER — Other Ambulatory Visit: Payer: Self-pay

## 2019-04-04 ENCOUNTER — Ambulatory Visit
Admission: RE | Admit: 2019-04-04 | Discharge: 2019-04-04 | Disposition: A | Discharge status: Home / Self Care | Payer: 59 | Source: Ambulatory Visit | Attending: Family Medicine | Admitting: Family Medicine

## 2019-04-04 DIAGNOSIS — Z1231 Encounter for screening mammogram for malignant neoplasm of breast: Secondary | ICD-10-CM | POA: Diagnosis not present

## 2019-05-12 MED FILL — METOPROLOL SUCCINATE ER 25: 25 | 90 days supply | Qty: 90 | Fill #1

## 2019-05-12 MED FILL — FINASTERIDE 5 MG TABLET: 5 | 90 days supply | Qty: 90 | Fill #1

## 2019-05-19 DIAGNOSIS — H1012 Acute atopic conjunctivitis, left eye: Secondary | ICD-10-CM | POA: Diagnosis not present

## 2019-05-20 MED FILL — NEO/POLYMYXIN/DEXAMETH DROP: 3.5-10000-0 | 13 days supply | Qty: 5 | Fill #0

## 2019-08-08 MED FILL — METOPROLOL SUCCINATE ER 25: 25 | 90 days supply | Qty: 90 | Fill #2

## 2019-08-08 MED FILL — FINASTERIDE 5 MG TABLET: 5 | 90 days supply | Qty: 90 | Fill #0

## 2019-09-24 ENCOUNTER — Encounter: Payer: Self-pay | Admitting: Family Medicine

## 2019-09-24 DIAGNOSIS — N1831 Chronic kidney disease, stage 3a: Secondary | ICD-10-CM

## 2019-09-26 DIAGNOSIS — N1831 Chronic kidney disease, stage 3a: Secondary | ICD-10-CM | POA: Diagnosis not present

## 2019-09-27 LAB — COMPLETE METABOLIC PANEL WITH GFR
AG Ratio: 1.4 (calc) (ref 1.0–2.5)
ALT: 19 U/L (ref 6–29)
AST: 25 U/L (ref 10–35)
Albumin: 4.2 g/dL (ref 3.6–5.1)
Alkaline phosphatase (APISO): 73 U/L (ref 37–153)
BUN: 15 mg/dL (ref 7–25)
CO2: 28 mmol/L (ref 20–32)
Calcium: 9 mg/dL (ref 8.6–10.4)
Chloride: 104 mmol/L (ref 98–110)
Creat: 0.93 mg/dL (ref 0.50–1.05)
GFR, Est African American: 78 mL/min/{1.73_m2} (ref >=60)
GFR, Est Non African American: 67 mL/min/{1.73_m2} (ref >=60)
Globulin: 3 g/dL (calc) (ref 1.9–3.7)
Glucose, Bld: 91 mg/dL (ref 65–99)
Potassium: 4.4 mmol/L (ref 3.5–5.3)
Sodium: 139 mmol/L (ref 135–146)
Total Bilirubin: 0.6 mg/dL (ref 0.2–1.2)
Total Protein: 7.2 g/dL (ref 6.1–8.1)

## 2019-10-20 DIAGNOSIS — L658 Other specified nonscarring hair loss: Secondary | ICD-10-CM | POA: Diagnosis not present

## 2019-11-14 MED FILL — FINASTERIDE 5 MG TABLET: 5 | 90 days supply | Qty: 90 | Fill #1

## 2019-11-14 MED FILL — METOPROLOL SUCCINATE ER 25: 25 | 90 days supply | Qty: 90 | Fill #3

## 2020-02-14 ENCOUNTER — Encounter: Payer: Self-pay | Admitting: Family Medicine

## 2020-02-14 DIAGNOSIS — Z0000 Encounter for general adult medical examination without abnormal findings: Secondary | ICD-10-CM

## 2020-02-14 DIAGNOSIS — R000 Tachycardia, unspecified: Secondary | ICD-10-CM

## 2020-02-15 MED ORDER — METOPROLOL SUCCINATE ER 25 MG PO TB24: 25.0000 mg | ORAL_TABLET | Freq: Every day | ORAL | 0 refills | Status: DC

## 2020-02-15 MED FILL — METOPROLOL SUCCINATE ER 25: 25 | 30 days supply | Qty: 30 | Fill #0

## 2020-02-15 MED FILL — FINASTERIDE 5 MG TABLET: 5 | 90 days supply | Qty: 90 | Fill #2

## 2020-03-05 ENCOUNTER — Other Ambulatory Visit: Payer: Self-pay | Admitting: Family Medicine

## 2020-03-05 DIAGNOSIS — Z1231 Encounter for screening mammogram for malignant neoplasm of breast: Secondary | ICD-10-CM

## 2020-03-19 ENCOUNTER — Other Ambulatory Visit: Payer: Self-pay

## 2020-03-19 DIAGNOSIS — Z0000 Encounter for general adult medical examination without abnormal findings: Secondary | ICD-10-CM

## 2020-03-19 DIAGNOSIS — R000 Tachycardia, unspecified: Secondary | ICD-10-CM

## 2020-03-19 MED ORDER — METOPROLOL SUCCINATE ER 25 MG PO TB24: 25.0000 mg | ORAL_TABLET | Freq: Every day | ORAL | 0 refills | Status: DC

## 2020-03-19 NOTE — Telephone Encounter (Signed)
Natalie David called for a refill of Metoprolol until her next appointment. She states she was left a message to reschedule the appointment.

## 2020-03-22 ENCOUNTER — Encounter: Payer: 59 | Admitting: Family Medicine

## 2020-04-04 ENCOUNTER — Ambulatory Visit (INDEPENDENT_AMBULATORY_CARE_PROVIDER_SITE_OTHER): Payer: 59 | Admitting: Family Medicine

## 2020-04-04 ENCOUNTER — Encounter: Payer: Self-pay | Admitting: Family Medicine

## 2020-04-04 VITALS — BP 95/62 | HR 70 | Ht 68.0 in | Wt 125.0 lb

## 2020-04-04 DIAGNOSIS — Z0000 Encounter for general adult medical examination without abnormal findings: Secondary | ICD-10-CM | POA: Diagnosis not present

## 2020-04-04 NOTE — Progress Notes (Signed)
Subjective:     Natalie David is a 59 y.o. female and is here for a comprehensive physical exam. The patient reports problems - low bp.  She is been on a beta-blocker for about 20 years to control her tachycardia.  The metoprolol has actually worked well but more recently she has been noticing blood pressures in the 90s.  She says most of the time she feels fine but occasionally she will feel a little lightheaded.  Social History   Socioeconomic History  . Marital status: Married    Spouse name: Tynleigh Birt  . Number of children: 1   . Years of education: Not on file  . Highest education level: Not on file  Occupational History  . Occupation: Programmer, multimedia: [employer]  Tobacco Use  . Smoking status: Never Smoker  . Smokeless tobacco: Never Used  Substance and Sexual Activity  . Alcohol use: Yes    Alcohol/week: 3.0 standard drinks    Types: 3 Glasses of wine per week  . Drug use: No  . Sexual activity: Yes    Partners: Male    Birth control/protection: Pill  Other Topics Concern  . Not on file  Social History Narrative   Some exercise, 3 days per week. Drinks diet soda one a day.     Social Determinants of Health   Financial Resource Strain:   . Difficulty of Paying Living Expenses:   Food Insecurity:   . Worried About Charity fundraiser in the Last Year:   . Arboriculturist in the Last Year:   Transportation Needs:   . Film/video editor (Medical):   Marland Kitchen Lack of Transportation (Non-Medical):   Physical Activity:   . Days of Exercise per Week:   . Minutes of Exercise per Session:   Stress:   . Feeling of Stress :   Social Connections:   . Frequency of Communication with Friends and Family:   . Frequency of Social Gatherings with Friends and Family:   . Attends Religious Services:   . Active Member of Clubs or Organizations:   . Attends Archivist Meetings:   Marland Kitchen Marital Status:   Intimate Partner Violence:   . Fear of  Current or Ex-Partner:   . Emotionally Abused:   Marland Kitchen Physically Abused:   . Sexually Abused:    Health Maintenance  Topic Date Due  . INFLUENZA VACCINE  05/13/2020  . MAMMOGRAM  04/03/2021  . COLONOSCOPY  02/12/2023  . PAP SMEAR-Modifier  03/15/2024  . TETANUS/TDAP  03/15/2029  . COVID-19 Vaccine  Completed  . Hepatitis C Screening  Completed  . HIV Screening  Completed    The following portions of the patient's history were reviewed and updated as appropriate: allergies, current medications, past family history, past medical history, past social history, past surgical history and problem list.  Review of Systems A comprehensive review of systems was negative.   Objective:    BP 95/62   Pulse 70   Ht 5\' 8"  (1.727 m)   Wt 125 lb (56.7 kg)   LMP 12/16/2009   SpO2 100%   BMI 19.01 kg/m  General appearance: alert, cooperative and appears stated age Head: Normocephalic, without obvious abnormality, atraumatic Eyes: conj clear, EOMi, PEERLA Ears: normal TM's and external ear canals both ears Nose: Nares normal. Septum midline. Mucosa normal. No drainage or sinus tenderness. Throat: lips, mucosa, and tongue normal; teeth and gums normal  Neck: no adenopathy, no carotid bruit, no JVD, supple, symmetrical, trachea midline and thyroid not enlarged, symmetric, no tenderness/mass/nodules Back: symmetric, no curvature. ROM normal. No CVA tenderness. Lungs: clear to auscultation bilaterally Breasts: normal appearance, no masses or tenderness, implants bilaterally Heart: regular rate and rhythm, S1, S2 normal, no murmur, click, rub or gallop Abdomen: soft, non-tender; bowel sounds normal; no masses,  no organomegaly Extremities: extremities normal, atraumatic, no cyanosis or edema Pulses: 2+ and symmetric Skin: Skin color, texture, turgor normal. No rashes or lesions Lymph nodes: Cervical, supraclavicular, and axillary nodes normal. Neurologic: Alert and oriented X 3, normal strength  and tone. Normal symmetric reflexes. Normal coordination and gait    Assessment:    Healthy female exam.      Plan:     See After Visit Summary for Counseling Recommendations   Keep up a regular exercise program and make sure you are eating a healthy diet Try to eat 4 servings of dairy a day, or if you are lactose intolerant take a calcium with vitamin D daily.  Your vaccines are up to date.   Tachycardia-we discussed options we could certainly try different medication but it could also lower her blood pressure so there is no guarantee on that.  There also are medications that can lower pulse without affecting blood pressure.  There is 1 in particular that she and the cardiologist had talked about previously but unfortunately it still not generic she says for now she will just take with her metoprolol and try to monitor for frequency of symptoms of lightheadedness dizziness or fatigue.

## 2020-04-04 NOTE — Patient Instructions (Signed)
Health Maintenance, Female Adopting a healthy lifestyle and getting preventive care are important in promoting health and wellness. Ask your health care provider about:  The right schedule for you to have regular tests and exams.  Things you can do on your own to prevent diseases and keep yourself healthy. What should I know about diet, weight, and exercise? Eat a healthy diet   Eat a diet that includes plenty of vegetables, fruits, low-fat dairy products, and lean protein.  Do not eat a lot of foods that are high in solid fats, added sugars, or sodium. Maintain a healthy weight Body mass index (BMI) is used to identify weight problems. It estimates body fat based on height and weight. Your health care provider can help determine your BMI and help you achieve or maintain a healthy weight. Get regular exercise Get regular exercise. This is one of the most important things you can do for your health. Most adults should:  Exercise for at least 150 minutes each week. The exercise should increase your heart rate and make you sweat (moderate-intensity exercise).  Do strengthening exercises at least twice a week. This is in addition to the moderate-intensity exercise.  Spend less time sitting. Even light physical activity can be beneficial. Watch cholesterol and blood lipids Have your blood tested for lipids and cholesterol at 60 years of age, then have this test every 5 years. Have your cholesterol levels checked more often if:  Your lipid or cholesterol levels are high.  You are older than 60 years of age.  You are at high risk for heart disease. What should I know about cancer screening? Depending on your health history and family history, you may need to have cancer screening at various ages. This may include screening for:  Breast cancer.  Cervical cancer.  Colorectal cancer.  Skin cancer.  Lung cancer. What should I know about heart disease, diabetes, and high blood  pressure? Blood pressure and heart disease  High blood pressure causes heart disease and increases the risk of stroke. This is more likely to develop in people who have high blood pressure readings, are of African descent, or are overweight.  Have your blood pressure checked: ? Every 3-5 years if you are 18-39 years of age. ? Every year if you are 40 years old or older. Diabetes Have regular diabetes screenings. This checks your fasting blood sugar level. Have the screening done:  Once every three years after age 40 if you are at a normal weight and have a low risk for diabetes.  More often and at a younger age if you are overweight or have a high risk for diabetes. What should I know about preventing infection? Hepatitis B If you have a higher risk for hepatitis B, you should be screened for this virus. Talk with your health care provider to find out if you are at risk for hepatitis B infection. Hepatitis C Testing is recommended for:  Everyone born from 1945 through 1965.  Anyone with known risk factors for hepatitis C. Sexually transmitted infections (STIs)  Get screened for STIs, including gonorrhea and chlamydia, if: ? You are sexually active and are younger than 60 years of age. ? You are older than 60 years of age and your health care provider tells you that you are at risk for this type of infection. ? Your sexual activity has changed since you were last screened, and you are at increased risk for chlamydia or gonorrhea. Ask your health care provider if   you are at risk.  Ask your health care provider about whether you are at high risk for HIV. Your health care provider may recommend a prescription medicine to help prevent HIV infection. If you choose to take medicine to prevent HIV, you should first get tested for HIV. You should then be tested every 3 months for as long as you are taking the medicine. Pregnancy  If you are about to stop having your period (premenopausal) and  you may become pregnant, seek counseling before you get pregnant.  Take 400 to 800 micrograms (mcg) of folic acid every day if you become pregnant.  Ask for birth control (contraception) if you want to prevent pregnancy. Osteoporosis and menopause Osteoporosis is a disease in which the bones lose minerals and strength with aging. This can result in bone fractures. If you are 65 years old or older, or if you are at risk for osteoporosis and fractures, ask your health care provider if you should:  Be screened for bone loss.  Take a calcium or vitamin D supplement to lower your risk of fractures.  Be given hormone replacement therapy (HRT) to treat symptoms of menopause. Follow these instructions at home: Lifestyle  Do not use any products that contain nicotine or tobacco, such as cigarettes, e-cigarettes, and chewing tobacco. If you need help quitting, ask your health care provider.  Do not use street drugs.  Do not share needles.  Ask your health care provider for help if you need support or information about quitting drugs. Alcohol use  Do not drink alcohol if: ? Your health care provider tells you not to drink. ? You are pregnant, may be pregnant, or are planning to become pregnant.  If you drink alcohol: ? Limit how much you use to 0-1 drink a day. ? Limit intake if you are breastfeeding.  Be aware of how much alcohol is in your drink. In the [place] one drink equals one 12 oz bottle of beer (355 mL), one 5 oz glass of wine (148 mL), or one 1 oz glass of hard liquor (44 mL). General instructions  Schedule regular health, dental, and eye exams.  Stay current with your vaccines.  Tell your health care provider if: ? You often feel depressed. ? You have ever been abused or do not feel safe at home. Summary  Adopting a healthy lifestyle and getting preventive care are important in promoting health and wellness.  Follow your health care provider's instructions about healthy  diet, exercising, and getting tested or screened for diseases.  Follow your health care provider's instructions on monitoring your cholesterol and blood pressure. This information is not intended to replace advice given to you by your health care provider. Make sure you discuss any questions you have with your health care provider. Document Revised: 09/22/2018 Document Reviewed: 09/22/2018 Elsevier Patient Education  2020 [employer] Inc[employer]  

## 2020-04-05 ENCOUNTER — Other Ambulatory Visit: Payer: Self-pay

## 2020-04-05 ENCOUNTER — Ambulatory Visit
Admission: RE | Admit: 2020-04-05 | Discharge: 2020-04-05 | Disposition: A | Discharge status: Home / Self Care | Payer: 59 | Source: Ambulatory Visit | Attending: Family Medicine | Admitting: Family Medicine

## 2020-04-05 DIAGNOSIS — Z1231 Encounter for screening mammogram for malignant neoplasm of breast: Secondary | ICD-10-CM | POA: Diagnosis not present

## 2020-04-05 LAB — CBC
HCT: 35.8 % (ref 35.0–45.0)
Hemoglobin: 11.8 g/dL (ref 11.7–15.5)
MCH: 31.6 pg (ref 27.0–33.0)
MCHC: 33 g/dL (ref 32.0–36.0)
MCV: 95.7 fL (ref 80.0–100.0)
MPV: 10.6 fL (ref 7.5–12.5)
Platelets: 171 10*3/uL (ref 140–400)
RBC: 3.74 10*6/uL — ABNORMAL LOW (ref 3.80–5.10)
RDW: 11.5 % (ref 11.0–15.0)
WBC: 3.9 10*3/uL (ref 3.8–10.8)

## 2020-04-05 LAB — COMPLETE METABOLIC PANEL WITH GFR
AG Ratio: 1.6 (calc) (ref 1.0–2.5)
ALT: 14 U/L (ref 6–29)
AST: 21 U/L (ref 10–35)
Albumin: 4.7 g/dL (ref 3.6–5.1)
Alkaline phosphatase (APISO): 80 U/L (ref 37–153)
BUN/Creatinine Ratio: 16 (calc) (ref 6–22)
BUN: 17 mg/dL (ref 7–25)
CO2: 28 mmol/L (ref 20–32)
Calcium: 9.8 mg/dL (ref 8.6–10.4)
Chloride: 101 mmol/L (ref 98–110)
Creat: 1.05 mg/dL — ABNORMAL HIGH (ref 0.50–0.99)
GFR, Est African American: 67 mL/min/{1.73_m2} (ref >=60)
GFR, Est Non African American: 58 mL/min/{1.73_m2} — ABNORMAL LOW (ref >=60)
Globulin: 3 g/dL (calc) (ref 1.9–3.7)
Glucose, Bld: 97 mg/dL (ref 65–99)
Potassium: 4.7 mmol/L (ref 3.5–5.3)
Sodium: 138 mmol/L (ref 135–146)
Total Bilirubin: 0.7 mg/dL (ref 0.2–1.2)
Total Protein: 7.7 g/dL (ref 6.1–8.1)

## 2020-04-05 LAB — LIPID PANEL
Cholesterol: 208 mg/dL — ABNORMAL HIGH (ref <200)
HDL: 88 mg/dL (ref >=50)
LDL Cholesterol (Calc): 104 mg/dL (calc) — ABNORMAL HIGH
Non-HDL Cholesterol (Calc): 120 mg/dL (calc) (ref <130)
Total CHOL/HDL Ratio: 2.4 (calc) (ref <5.0)
Triglycerides: 72 mg/dL (ref <150)

## 2020-04-05 LAB — TSH: TSH: 0.81 mIU/L (ref 0.40–4.50)

## 2020-04-19 ENCOUNTER — Other Ambulatory Visit: Payer: Self-pay | Admitting: Family Medicine

## 2020-04-19 DIAGNOSIS — R000 Tachycardia, unspecified: Secondary | ICD-10-CM

## 2020-04-19 DIAGNOSIS — Z0000 Encounter for general adult medical examination without abnormal findings: Secondary | ICD-10-CM

## 2020-04-19 MED FILL — METOPROLOL SUCCINATE ER 25: 25 | 30 days supply | Qty: 30 | Fill #0

## 2020-04-20 ENCOUNTER — Encounter: Payer: Self-pay | Admitting: Family Medicine

## 2020-04-24 ENCOUNTER — Other Ambulatory Visit: Payer: Self-pay | Admitting: *Deleted

## 2020-04-24 DIAGNOSIS — Z0000 Encounter for general adult medical examination without abnormal findings: Secondary | ICD-10-CM

## 2020-04-24 DIAGNOSIS — R000 Tachycardia, unspecified: Secondary | ICD-10-CM

## 2020-04-24 MED ORDER — METOPROLOL SUCCINATE ER 25 MG PO TB24: 25.0000 mg | ORAL_TABLET | Freq: Every day | ORAL | 3 refills | Status: DC

## 2020-05-15 ENCOUNTER — Other Ambulatory Visit (HOSPITAL_COMMUNITY): Payer: Self-pay | Admitting: *Deleted

## 2020-05-15 MED FILL — FINASTERIDE 5 MG TABLET: 5 | 90 days supply | Qty: 90 | Fill #0

## 2020-05-16 MED FILL — METOPROLOL SUCCINATE ER 25: 25 | 90 days supply | Qty: 90 | Fill #0

## 2020-08-13 MED FILL — METOPROLOL SUCCINATE ER 25: 25 | 90 days supply | Qty: 90 | Fill #1

## 2020-08-13 MED FILL — FINASTERIDE 5 MG TABLET: 5 | 90 days supply | Qty: 90 | Fill #1

## 2020-08-29 DIAGNOSIS — L648 Other androgenic alopecia: Secondary | ICD-10-CM | POA: Diagnosis not present

## 2020-08-29 DIAGNOSIS — D1801 Hemangioma of skin and subcutaneous tissue: Secondary | ICD-10-CM | POA: Diagnosis not present

## 2020-08-29 DIAGNOSIS — L578 Other skin changes due to chronic exposure to nonionizing radiation: Secondary | ICD-10-CM | POA: Diagnosis not present

## 2020-08-29 DIAGNOSIS — L821 Other seborrheic keratosis: Secondary | ICD-10-CM | POA: Diagnosis not present

## 2020-08-29 DIAGNOSIS — D225 Melanocytic nevi of trunk: Secondary | ICD-10-CM | POA: Diagnosis not present

## 2020-08-29 DIAGNOSIS — L820 Inflamed seborrheic keratosis: Secondary | ICD-10-CM | POA: Diagnosis not present

## 2020-08-29 DIAGNOSIS — L738 Other specified follicular disorders: Secondary | ICD-10-CM | POA: Diagnosis not present

## 2020-10-14 ENCOUNTER — Encounter: Payer: Self-pay | Admitting: Family Medicine

## 2020-10-15 ENCOUNTER — Other Ambulatory Visit: Payer: Self-pay | Admitting: *Deleted

## 2020-10-15 DIAGNOSIS — N1831 Chronic kidney disease, stage 3a: Secondary | ICD-10-CM

## 2020-10-18 ENCOUNTER — Encounter: Payer: Self-pay | Admitting: Family Medicine

## 2020-10-18 LAB — COMPLETE METABOLIC PANEL WITH GFR
AG Ratio: 1.6 (calc) (ref 1.0–2.5)
ALT: 14 U/L (ref 6–29)
AST: 22 U/L (ref 10–35)
Albumin: 4.4 g/dL (ref 3.6–5.1)
Alkaline phosphatase (APISO): 69 U/L (ref 37–153)
BUN: 17 mg/dL (ref 7–25)
CO2: 30 mmol/L (ref 20–32)
Calcium: 9.4 mg/dL (ref 8.6–10.4)
Chloride: 100 mmol/L (ref 98–110)
Creat: 0.93 mg/dL (ref 0.50–0.99)
GFR, Est African American: 77 mL/min/{1.73_m2} (ref >=60)
GFR, Est Non African American: 67 mL/min/{1.73_m2} (ref >=60)
Globulin: 2.8 g/dL (calc) (ref 1.9–3.7)
Glucose, Bld: 96 mg/dL (ref 65–99)
Potassium: 4.1 mmol/L (ref 3.5–5.3)
Sodium: 137 mmol/L (ref 135–146)
Total Bilirubin: 0.4 mg/dL (ref 0.2–1.2)
Total Protein: 7.2 g/dL (ref 6.1–8.1)

## 2020-10-18 LAB — MICROALBUMIN, URINE: Microalb, Ur: <0.2 mg/dL

## 2020-11-06 MED FILL — METOPROLOL SUCCINATE ER 25: 25 | 90 days supply | Qty: 90 | Fill #2

## 2020-11-12 ENCOUNTER — Other Ambulatory Visit (HOSPITAL_COMMUNITY): Payer: Self-pay

## 2020-11-12 MED FILL — FINASTERIDE 5 MG TABLET: 5 | 90 days supply | Qty: 90 | Fill #0

## 2021-02-07 ENCOUNTER — Other Ambulatory Visit (HOSPITAL_COMMUNITY): Payer: Self-pay

## 2021-02-07 MED FILL — Finasteride Tab 5 MG: ORAL | 90 days supply | Qty: 90 | Fill #0 | Status: AC

## 2021-02-07 MED FILL — Metoprolol Succinate Tab ER 24HR 25 MG (Tartrate Equiv): ORAL | 90 days supply | Qty: 90 | Fill #0 | Status: AC

## 2021-03-12 ENCOUNTER — Other Ambulatory Visit: Payer: Self-pay | Admitting: Family Medicine

## 2021-03-12 DIAGNOSIS — Z1231 Encounter for screening mammogram for malignant neoplasm of breast: Secondary | ICD-10-CM

## 2021-04-10 ENCOUNTER — Other Ambulatory Visit (HOSPITAL_COMMUNITY): Payer: Self-pay

## 2021-04-10 ENCOUNTER — Telehealth: Payer: Self-pay | Admitting: Physician Assistant

## 2021-04-10 ENCOUNTER — Encounter: Payer: No Typology Code available for payment source | Admitting: Family Medicine

## 2021-04-10 ENCOUNTER — Encounter: Payer: Self-pay | Admitting: Physician Assistant

## 2021-04-10 ENCOUNTER — Other Ambulatory Visit: Payer: Self-pay

## 2021-04-10 ENCOUNTER — Ambulatory Visit (INDEPENDENT_AMBULATORY_CARE_PROVIDER_SITE_OTHER): Payer: No Typology Code available for payment source | Admitting: Physician Assistant

## 2021-04-10 VITALS — BP 101/69 | HR 73 | Wt 123.0 lb

## 2021-04-10 DIAGNOSIS — N1831 Chronic kidney disease, stage 3a: Secondary | ICD-10-CM

## 2021-04-10 DIAGNOSIS — M8580 Other specified disorders of bone density and structure, unspecified site: Secondary | ICD-10-CM | POA: Diagnosis not present

## 2021-04-10 DIAGNOSIS — R000 Tachycardia, unspecified: Secondary | ICD-10-CM | POA: Diagnosis not present

## 2021-04-10 DIAGNOSIS — Z0000 Encounter for general adult medical examination without abnormal findings: Secondary | ICD-10-CM

## 2021-04-10 DIAGNOSIS — N951 Menopausal and female climacteric states: Secondary | ICD-10-CM

## 2021-04-10 DIAGNOSIS — Z780 Asymptomatic menopausal state: Secondary | ICD-10-CM

## 2021-04-10 MED ORDER — INTRAROSA 6.5 MG VA INST
6.5000 mg | VAGINAL_INSERT | Freq: Every day | VAGINAL | 11 refills | Status: DC
  Filled 2021-04-10: qty 28, 28d supply, fill #0
  Filled 2021-05-08: qty 28, 28d supply, fill #1
  Filled 2021-06-07: qty 28, 28d supply, fill #2
  Filled 2021-07-03: qty 28, 28d supply, fill #3

## 2021-04-10 MED ORDER — METOPROLOL SUCCINATE ER 25 MG PO TB24
25.0000 mg | ORAL_TABLET | Freq: Every day | ORAL | 3 refills | Status: DC
  Filled 2021-04-10: qty 90, fill #0
  Filled 2021-05-08: qty 72, 72d supply, fill #0
  Filled 2021-05-08: qty 18, 18d supply, fill #0
  Filled 2021-08-12: qty 90, 90d supply, fill #1
  Filled 2021-11-10: qty 90, 90d supply, fill #2
  Filled 2021-11-11: qty 90, 90d supply, fill #0
  Filled 2022-02-05: qty 90, 90d supply, fill #1

## 2021-04-10 NOTE — Patient Instructions (Addendum)
Atrophic Vaginitis  Atrophic vaginitis is when the lining of the vagina becomes dry and thin. This is most common in women who have stopped having their periods (are in menopause). It usually starts when a woman is 32 to 61 years old. What are the causes? This condition is caused by a drop in a female hormone (estrogen). What increases the risk? You are more likely to develop this condition if: You take certain medicines. You have had your ovaries taken out. You are being treated for cancer. You have given birth or are breastfeeding. You are more than 61 years old. You smoke. What are the signs or symptoms? Pain during sex. A feeling of pressure during sex. Bleeding during sex. Burning or itching in the vagina. Burning pain when you pee (urinate). Fluid coming from your vagina. Some people do not have symptoms. How is this treated? Using a lubricant before sex. Using a moisturizer in the vagina. Using estrogen in the vagina. In some cases, you may not need treatment. Follow these instructions at home: Medicines Take all medicines only as told by your doctor. This includes medicines for dryness. Do not use herbal medicines unless your doctor says it is okay. General instructions Talk with your doctor about treatment. Do not douche. Do not use scented: Sprays. Tampons. Soaps. If sex hurts, try using lubricants right before you have sex. Contact a doctor if: You have fluid coming from the vagina that is not like normal. You have a bad smell coming from your vagina. You have new symptoms. Your symptoms do not get better when treated. Your symptoms get worse. Summary This condition happens when the lining of the vagina becomes dry and thin. It is most common in women who no longer have periods. Treatment may include using medicines for dryness. Call a doctor if your symptoms do not get better. This information is not intended to replace advice given to you by your health  care provider. Make sure you discuss any questions you have with your healthcare provider. Document Revised: 03/29/2020 Document Reviewed: 03/29/2020 Elsevier Patient Education  [place] Maintenance, Female Adopting a healthy lifestyle and getting preventive care are important in promoting health and wellness. Ask your health care provider about: The right schedule for you to have regular tests and exams. Things you can do on your own to prevent diseases and keep yourself healthy. What should I know about diet, weight, and exercise? Eat a healthy diet  Eat a diet that includes plenty of vegetables, fruits, low-fat dairy products, and lean protein. Do not eat a lot of foods that are high in solid fats, added sugars, or sodium.  Maintain a healthy weight Body mass index (BMI) is used to identify weight problems. It estimates body fat based on height and weight. Your health care provider can help determineyour BMI and help you achieve or maintain a healthy weight. Get regular exercise Get regular exercise. This is one of the most important things you can do for your health. Most adults should: Exercise for at least 150 minutes each week. The exercise should increase your heart rate and make you sweat (moderate-intensity exercise). Do strengthening exercises at least twice a week. This is in addition to the moderate-intensity exercise. Spend less time sitting. Even light physical activity can be beneficial. Watch cholesterol and blood lipids Have your blood tested for lipids and cholesterol at 61 years of age, then havethis test every 5 years. Have your cholesterol levels checked more often if:  Your lipid or cholesterol levels are high. You are older than 61 years of age. You are at high risk for heart disease. What should I know about cancer screening? Depending on your health history and family history, you may need to have cancer screening at various ages. This may  include screening for: Breast cancer. Cervical cancer. Colorectal cancer. Skin cancer. Lung cancer. What should I know about heart disease, diabetes, and high blood pressure? Blood pressure and heart disease High blood pressure causes heart disease and increases the risk of stroke. This is more likely to develop in people who have high blood pressure readings, are of African descent, or are overweight. Have your blood pressure checked: Every 3-5 years if you are 19-59 years of age. Every year if you are 61 years old or older. Diabetes Have regular diabetes screenings. This checks your fasting blood sugar level. Have the screening done: Once every three years after age 15 if you are at a normal weight and have a low risk for diabetes. More often and at a younger age if you are overweight or have a high risk for diabetes. What should I know about preventing infection? Hepatitis B If you have a higher risk for hepatitis B, you should be screened for this virus. Talk with your health care provider to find out if you are at risk forhepatitis B infection. Hepatitis C Testing is recommended for: Everyone born from [birth year] through 1965. Anyone with known risk factors for hepatitis C. Sexually transmitted infections (STIs) Get screened for STIs, including gonorrhea and chlamydia, if: You are sexually active and are younger than 61 years of age. You are older than 61 years of age and your health care provider tells you that you are at risk for this type of infection. Your sexual activity has changed since you were last screened, and you are at increased risk for chlamydia or gonorrhea. Ask your health care provider if you are at risk. Ask your health care provider about whether you are at high risk for HIV. Your health care provider may recommend a prescription medicine to help prevent HIV infection. If you choose to take medicine to prevent HIV, you should first get tested for HIV. You should then be  tested every 3 months for as long as you are taking the medicine. Pregnancy If you are about to stop having your period (premenopausal) and you may become pregnant, seek counseling before you get pregnant. Take 400 to 800 micrograms (mcg) of folic acid every day if you become pregnant. Ask for birth control (contraception) if you want to prevent pregnancy. Osteoporosis and menopause Osteoporosis is a disease in which the bones lose minerals and strength with aging. This can result in bone fractures. If you are 41 years old or older, or if you are at risk for osteoporosis and fractures, ask your health care provider if you should: Be screened for bone loss. Take a calcium or vitamin D supplement to lower your risk of fractures. Be given hormone replacement therapy (HRT) to treat symptoms of menopause. Follow these instructions at home: Lifestyle Do not use any products that contain nicotine or tobacco, such as cigarettes, e-cigarettes, and chewing tobacco. If you need help quitting, ask your health care provider. Do not use street drugs. Do not share needles. Ask your health care provider for help if you need support or information about quitting drugs. Alcohol use Do not drink alcohol if: Your health care provider tells you not to drink. You are  pregnant, may be pregnant, or are planning to become pregnant. If you drink alcohol: Limit how much you use to 0-1 drink a day. Limit intake if you are breastfeeding. Be aware of how much alcohol is in your drink. In the [place] one drink equals one 12 oz bottle of beer (355 mL), one 5 oz glass of wine (148 mL), or one 1 oz glass of hard liquor (44 mL). General instructions Schedule regular health, dental, and eye exams. Stay current with your vaccines. Tell your health care provider if: You often feel depressed. You have ever been abused or do not feel safe at home. Summary Adopting a healthy lifestyle and getting preventive care are important  in promoting health and wellness. Follow your health care provider's instructions about healthy diet, exercising, and getting tested or screened for diseases. Follow your health care provider's instructions on monitoring your cholesterol and blood pressure. This information is not intended to replace advice given to you by your health care provider. Make sure you discuss any questions you have with your healthcare provider. Document Revised: 09/22/2018 Document Reviewed: 09/22/2018 Elsevier Patient Education  2022 [employer].

## 2021-04-10 NOTE — Telephone Encounter (Signed)
Mychart message

## 2021-04-10 NOTE — Progress Notes (Signed)
Subjective:     Natalie David is a 60 y.o. female and is here for a comprehensive physical exam. The patient reports problems - with vaginal dryness. She is menopausal and now off all oral estrogen. She does not want to go back on estrogen .  Social History   Socioeconomic History   Marital status: Married    Spouse name: Shaneequa Bahner   Number of children: 1    Years of education: Not on file   Highest education level: Not on file  Occupational History   Occupation: Programmer, multimedia: [employer]    Comment: Cardiac Rehab  Tobacco Use   Smoking status: Never   Smokeless tobacco: Never  Substance and Sexual Activity   Alcohol use: Yes    Alcohol/week: 3.0 standard drinks    Types: 3 Glasses of wine per week   Drug use: No   Sexual activity: Yes    Partners: Male    Birth control/protection: Pill  Other Topics Concern   Not on file  Social History Narrative   Some exercise, 3 days per week. Drinks diet soda one a day.     Social Determinants of Health   Financial Resource Strain: Not on file  Food Insecurity: Not on file  Transportation Needs: Not on file  Physical Activity: Not on file  Stress: Not on file  Social Connections: Not on file  Intimate Partner Violence: Not on file   Health Maintenance  Topic Date Due   DEXA SCAN  01/09/2017   COVID-19 Vaccine (4 - Booster for Pfizer series) 11/27/2020   INFLUENZA VACCINE  05/13/2021   MAMMOGRAM  04/05/2022   COLONOSCOPY (Pts 45-69yrs Insurance coverage will need to be confirmed)  02/12/2023   PAP SMEAR-Modifier  03/15/2024   TETANUS/TDAP  03/15/2029   Hepatitis C Screening  Completed   HIV Screening  Completed   Zoster Vaccines- Shingrix  Completed   Pneumococcal Vaccine 34-63 Years old  Aged Out   HPV VACCINES  Aged Out    The following portions of the patient's history were reviewed and updated as appropriate: allergies, current medications, past family history, past medical history, past social history,  past surgical history, and problem list.  Review of Systems Pertinent items noted in HPI and remainder of comprehensive ROS otherwise negative.   Objective:    BP 101/69   Pulse 73   Wt 123 lb (55.8 kg)   LMP 12/16/2009   SpO2 100%   BMI 18.70 kg/m  General appearance: alert, cooperative, and appears stated age Head: Normocephalic, without obvious abnormality, atraumatic Eyes: conjunctivae/corneas clear. PERRL, EOM's intact. Fundi benign. Ears: normal TM's and external ear canals both ears Nose: Nares normal. Septum midline. Mucosa normal. No drainage or sinus tenderness. Throat: lips, mucosa, and tongue normal; teeth and gums normal Neck: no adenopathy, no carotid bruit, no JVD, supple, symmetrical, trachea midline, and thyroid not enlarged, symmetric, no tenderness/mass/nodules Back: symmetric, no curvature. ROM normal. No CVA tenderness. Lungs: clear to auscultation bilaterally Breasts: normal appearance, no masses or tenderness, bilateral breast implants Heart: regular rate and rhythm, S1, S2 normal, no murmur, click, rub or gallop Abdomen: soft, non-tender; bowel sounds normal; no masses,  no organomegaly Extremities: extremities normal, atraumatic, no cyanosis or edema Pulses: 2+ and symmetric Skin: Skin color, texture, turgor normal. No rashes or lesions Lymph nodes: Cervical, supraclavicular, and axillary nodes normal. Neurologic: Grossly normal   .. Depression screen [facility] 2/9 04/10/2021 04/04/2020 03/16/2019 02/10/2019 02/03/2018  Decreased Interest  0 0 0 0 0  Down, Depressed, Hopeless 0 0 0 0 0  PHQ - 2 Score 0 0 0 0 0    Assessment:    Healthy female exam.     Plan:    Marland KitchenMarland KitchenMikeala was seen today for annual exam.  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  Post-menopausal -     DG Bone Density; Future -     Prasterone (INTRAROSA) 6.5 MG INST; Place 6.5 mg vaginally at bedtime.  Vaginal dryness, menopausal -     Prasterone (INTRAROSA) 6.5 MG INST; Place 6.5 mg vaginally at bedtime. .. Discussed 150 minutes of exercise a week.  Encouraged vitamin D 1000 units and Calcium 1300mg  or 4 servings of dairy a day.  PHQ no concerns.  Fasting labs ordered today.  Mammogram UTD.  Colonoscopy UTD.  Pap UTD.  Needs bone density for osteopenia. Ordered today.  Covid and shingles UTD.   Discussed vaginal atrophy. Non hormonal option is intrarosa. Sent to pharmacy. Could also try estrace as well.    See After Visit Summary for Counseling Recommendations

## 2021-04-11 ENCOUNTER — Other Ambulatory Visit (HOSPITAL_COMMUNITY): Payer: Self-pay

## 2021-04-11 LAB — COMPLETE METABOLIC PANEL WITH GFR
AG Ratio: 1.6 (calc) (ref 1.0–2.5)
ALT: 15 U/L (ref 6–29)
AST: 27 U/L (ref 10–35)
Albumin: 4.7 g/dL (ref 3.6–5.1)
Alkaline phosphatase (APISO): 77 U/L (ref 37–153)
BUN/Creatinine Ratio: 14 (calc) (ref 6–22)
BUN: 15 mg/dL (ref 7–25)
CO2: 28 mmol/L (ref 20–32)
Calcium: 9.9 mg/dL (ref 8.6–10.4)
Chloride: 102 mmol/L (ref 98–110)
Creat: 1.11 mg/dL — ABNORMAL HIGH (ref 0.50–0.99)
GFR, Est African American: 62 mL/min/{1.73_m2} (ref >=60)
GFR, Est Non African American: 54 mL/min/{1.73_m2} — ABNORMAL LOW (ref >=60)
Globulin: 2.9 g/dL (calc) (ref 1.9–3.7)
Glucose, Bld: 92 mg/dL (ref 65–99)
Potassium: 4.3 mmol/L (ref 3.5–5.3)
Sodium: 138 mmol/L (ref 135–146)
Total Bilirubin: 0.8 mg/dL (ref 0.2–1.2)
Total Protein: 7.6 g/dL (ref 6.1–8.1)

## 2021-04-11 LAB — LIPID PANEL W/REFLEX DIRECT LDL
Cholesterol: 197 mg/dL (ref <200)
HDL: 82 mg/dL (ref >=50)
LDL Cholesterol (Calc): 99 mg/dL (calc)
Non-HDL Cholesterol (Calc): 115 mg/dL (calc) (ref <130)
Total CHOL/HDL Ratio: 2.4 (calc) (ref <5.0)
Triglycerides: 70 mg/dL (ref <150)

## 2021-04-11 LAB — VITAMIN D 25 HYDROXY (VIT D DEFICIENCY, FRACTURES): Vit D, 25-Hydroxy: 70 ng/mL (ref 30–100)

## 2021-04-11 LAB — TSH: TSH: 0.89 mIU/L (ref 0.40–4.50)

## 2021-04-11 LAB — CBC
HCT: 36.3 % (ref 35.0–45.0)
Hemoglobin: 12.4 g/dL (ref 11.7–15.5)
MCH: 32.9 pg (ref 27.0–33.0)
MCHC: 34.2 g/dL (ref 32.0–36.0)
MCV: 96.3 fL (ref 80.0–100.0)
MPV: 11 fL (ref 7.5–12.5)
Platelets: 164 10*3/uL (ref 140–400)
RBC: 3.77 10*6/uL — ABNORMAL LOW (ref 3.80–5.10)
RDW: 11.6 % (ref 11.0–15.0)
WBC: 3.4 10*3/uL — ABNORMAL LOW (ref 3.8–10.8)

## 2021-04-11 NOTE — Progress Notes (Signed)
Marjani,   TSH looks great.  Cholesterol looks fantastic.  Vitamin D looks amazing.  Hemoglobin good.  WBC a little low but no other concerns in CBC. Could be a virus you are fighting. Ok to recheck in 4 weeks.  Your kidney function dropped a little. GFR 54 from 5 months ago 67. Could have been the fasting state and some dehydration. Recheck in 3-6 months.

## 2021-04-23 ENCOUNTER — Encounter (HOSPITAL_BASED_OUTPATIENT_CLINIC_OR_DEPARTMENT_OTHER): Payer: Self-pay | Admitting: Obstetrics and Gynecology

## 2021-04-23 ENCOUNTER — Ambulatory Visit (INDEPENDENT_AMBULATORY_CARE_PROVIDER_SITE_OTHER): Payer: No Typology Code available for payment source | Admitting: Ophthalmology

## 2021-04-23 ENCOUNTER — Encounter (INDEPENDENT_AMBULATORY_CARE_PROVIDER_SITE_OTHER): Payer: Self-pay | Admitting: Ophthalmology

## 2021-04-23 ENCOUNTER — Other Ambulatory Visit: Payer: Self-pay

## 2021-04-23 ENCOUNTER — Encounter (INDEPENDENT_AMBULATORY_CARE_PROVIDER_SITE_OTHER): Payer: Self-pay

## 2021-04-23 ENCOUNTER — Emergency Department (HOSPITAL_BASED_OUTPATIENT_CLINIC_OR_DEPARTMENT_OTHER)
Admission: EM | Admit: 2021-04-23 | Discharge: 2021-04-23 | Disposition: A | Discharge status: Home / Self Care | Payer: No Typology Code available for payment source | Attending: Emergency Medicine | Admitting: Emergency Medicine

## 2021-04-23 ENCOUNTER — Encounter: Payer: Self-pay | Admitting: Family Medicine

## 2021-04-23 DIAGNOSIS — H538 Other visual disturbances: Secondary | ICD-10-CM | POA: Insufficient documentation

## 2021-04-23 DIAGNOSIS — N1831 Chronic kidney disease, stage 3a: Secondary | ICD-10-CM | POA: Diagnosis not present

## 2021-04-23 DIAGNOSIS — H33311 Horseshoe tear of retina without detachment, right eye: Secondary | ICD-10-CM | POA: Diagnosis not present

## 2021-04-23 DIAGNOSIS — H539 Unspecified visual disturbance: Secondary | ICD-10-CM

## 2021-04-23 DIAGNOSIS — H4311 Vitreous hemorrhage, right eye: Secondary | ICD-10-CM | POA: Diagnosis not present

## 2021-04-23 NOTE — ED Provider Notes (Signed)
Emergency Department Provider Note   I have reviewed the triage vital signs and the nursing notes.   HISTORY  Chief Complaint Eye Problem   HPI Natalie David is a 60 y.o. female with past medical history reviewed below presents to the emergency department with vision change in the right eye.  Symptoms began 2 days ago with a gold and sometimes dark appearing In the peripheral vision.  She states initially it seemed like maybe her hair was in her vision but it was persistent.  She had some darker spots which would pop up and then disappear and then reappear in different parts of her vision.  No sparks or flashes.  No eye pain.  She had some vertical dark lines which also appeared in her vision.  She is not having chest pain, headache.  Denies injury to the eye.  She called her optometrist but was told she could not be seen for another 6 months and so presents to the emergency department for evaluation.   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   Osteopenia 12/25/2015   CKD stage G3a/A1, GFR 45-59 and albumin creatinine ratio <30 mg/g ([place]) 12/12/2012   Tachycardia 12/11/2011   Elevated MCV 12/11/2011    Past Surgical History:  Procedure Laterality Date   AUGMENTATION MAMMAPLASTY     BLEPHAROPLASTY  2011   BREAST ENHANCEMENT SURGERY  2010   COLONOSCOPY     LAPAROSCOPY     for endometriosis   LAPAROTOMY     Rt ectopic   LYMPH NODE BIOPSY     Rt underarm   NASAL SEPTUM SURGERY      Allergies Codeine  Family History  Problem Relation Age of Onset   Heart disease Father    Hypertension Father    Thyroid disease Father    Aortic aneurysm Father    Cirrhosis Father        Medication side effect.    Hypertension Sister    Thyroid disease Other    Hyperlipidemia  Daughter    Hyperlipidemia Sister    Kidney disease Mother    Aortic aneurysm Mother    COPD Mother        worked in Research scientist (life sciences) cancer Neg Hx    Esophageal cancer Neg Hx    Rectal cancer Neg Hx    Stomach cancer Neg Hx    Breast cancer Neg Hx     Social History Social History   Tobacco Use   Smoking status: Never   Smokeless tobacco: Never  Vaping Use   Vaping Use: Never used  Substance Use Topics   Alcohol use: Yes    Alcohol/week: 3.0 standard drinks    Types: 3 Glasses of wine per week   Drug use: No    Review of Systems  Constitutional: No fever/chills Eyes: Positive visual changes. ENT: No sore throat. Cardiovascular: Denies chest pain. Respiratory: Denies shortness of breath. Gastrointestinal: No abdominal pain.  No nausea, no vomiting.  No diarrhea.  No constipation. Genitourinary: Negative for dysuria. Musculoskeletal: Negative for back pain. Skin: Negative for rash. Neurological: Negative for headaches, focal weakness or numbness.  10-point ROS otherwise negative.  ____________________________________________   PHYSICAL EXAM:  VITAL SIGNS: ED Triage Vitals [04/23/21 0906]  Enc Vitals Group  BP 127/82     Pulse Rate 82     Resp 14     Temp 98.4 F (36.9 C)     Temp src      SpO2 100 %    Constitutional: Alert and oriented. Well appearing and in no acute distress. Eyes: Conjunctivae are normal. PERRL. EOMI. no apparent abnormality on funduscopic exam. Visual acuity: 20/40 left and 20/50 right.  Head: Atraumatic. Nose: No congestion/rhinnorhea. Mouth/Throat: Mucous membranes are moist. Neck: No stridor.   Cardiovascular: Normal rate, regular rhythm.  Respiratory: Normal respiratory effort.   Gastrointestinal: No distention.  Musculoskeletal:  No gross deformities of extremities. Neurologic:  Normal speech and language. Skin:  Skin is warm, dry and intact. No rash  noted.  ____________________________________________   PROCEDURES  Procedure(s) performed:   Procedures  None  ____________________________________________   INITIAL IMPRESSION / ASSESSMENT AND PLAN / ED COURSE  Pertinent labs & imaging results that were available during my care of the patient were reviewed by me and considered in my medical decision making (see chart for details).   Patient presents emergency department vision change in the right eye.  She is not having sparks or flashes to strongly suspect retinal detachment.  Seems more consistent with lines and floaters.  No eye pain to suspect glaucoma.  No eye injuries.  No other symptoms to suspect central process for vision changes or stroke.  Discussed the case with Dr. Talbert Forest on-call with ophthalmology.  He can see the patient in office today.  Advised that she come over and they will work her in for a dilated exam.  Patient in agreement with plan.    ____________________________________________  FINAL CLINICAL IMPRESSION(S) / ED DIAGNOSES  Final diagnoses:  Vision changes    Note:  This document was prepared using Dragon voice recognition software and may include unintentional dictation errors.  Nanda Quinton, MD, [facility] Emergency Medicine    Dailon Sheeran, [facility], MD 04/23/21 [phone]

## 2021-04-23 NOTE — Discharge Instructions (Addendum)
You were seen in the emergency department today with vision changes.  I have arranged for you to be seen by Dr. Talbert Forest in the office today for a dilated eye exam.  He can see you if you go directly to his office after being discharged from here.

## 2021-04-23 NOTE — Progress Notes (Signed)
04/23/2021     CHIEF COMPLAINT Patient presents for Retina Evaluation (NP- Vit hemorrhage in OD.- Ref by T Bevis/Pt states, "Sunday I saw a gold outline in my OD but it went away. This morning I was at work and all of a sudden I saw a dark netting come over my vision OD. Then it disappeared.")   HISTORY OF PRESENT ILLNESS: Natalie David is a 60 y.o. female who presents to the clinic today for:   HPI     Retina Evaluation           Laterality: right eye   Onset: 2 days ago   Duration: 2 days   Associated Symptoms: Floaters (Some dark floating spots OD).  Negative for Flashes, Distortion, Blind Spot and Pain   Comments: NP- Vit hemorrhage in OD.- Ref by T Bevis Pt states, "Sunday I saw a gold outline in my OD but it went away. This morning I was at work and all of a sudden I saw a dark netting come over my vision OD. Then it disappeared."       Last edited by Kendra Opitz, COA on 04/23/2021  1:11 PM.      Referring physician: Hali Marry, MD [place] [address],  [place] [address]  HISTORICAL INFORMATION:   Selected notes from the MEDICAL RECORD NUMBER    Lab Results  Component Value Date   HGBA1C 5.2 03/16/2019     CURRENT MEDICATIONS: No current outpatient medications on file. (Ophthalmic Drugs)   No current facility-administered medications for this visit. (Ophthalmic Drugs)   Current Outpatient Medications (Other)  Medication Sig   Calcium Carb-Cholecalciferol 600-800 MG-UNIT TABS Take 1 tablet by mouth 2 (two) times daily.   finasteride (PROSCAR) 5 MG tablet Take 5 mg by mouth daily.   finasteride (PROSCAR) 5 MG tablet TAKE 1 TABLET (5 MG TOTAL) BY MOUTH DAILY.   metoprolol succinate (TOPROL-XL) 25 MG 24 hr tablet Take 1 tablet (25 mg total) by mouth daily.   minoxidil (ROGAINE) 2 % external solution Apply topically daily.    Multiple Vitamins-Minerals (WOMENS MULTIVITAMIN PLUS PO) Take 1 capsule by mouth daily.    Omega-3 Fatty Acids (FISH OIL) 1000 MG CAPS Take by mouth 2 (two) times daily.   Prasterone (INTRAROSA) 6.5 MG INST Place 6.5 mg vaginally at bedtime.   No current facility-administered medications for this visit. (Other)      REVIEW OF SYSTEMS:    ALLERGIES Allergies  Allergen Reactions   Codeine Nausea Only    PAST MEDICAL HISTORY Past Medical History:  Diagnosis Date   Osteoporosis    Tachycardia    Past Surgical History:  Procedure Laterality Date   AUGMENTATION MAMMAPLASTY     BLEPHAROPLASTY  2011   BREAST ENHANCEMENT SURGERY  2010   COLONOSCOPY     LAPAROSCOPY     for endometriosis   LAPAROTOMY     Rt ectopic   LYMPH NODE BIOPSY     Rt underarm   NASAL SEPTUM SURGERY      FAMILY HISTORY Family History  Problem Relation Age of Onset   Heart disease Father    Hypertension Father    Thyroid disease Father    Aortic aneurysm Father    Cirrhosis Father        Medication side effect.    Hypertension Sister    Thyroid disease Other    Hyperlipidemia Daughter    Hyperlipidemia Sister    Kidney  disease Mother    Aortic aneurysm Mother    COPD Mother        worked in Research scientist (life sciences) cancer Neg Hx    Esophageal cancer Neg Hx    Rectal cancer Neg Hx    Stomach cancer Neg Hx    Breast cancer Neg Hx     SOCIAL HISTORY Social History   Tobacco Use   Smoking status: Never   Smokeless tobacco: Never  Vaping Use   Vaping Use: Never used  Substance Use Topics   Alcohol use: Yes    Alcohol/week: 3.0 standard drinks    Types: 3 Glasses of wine per week   Drug use: No         OPHTHALMIC EXAM:  Base Eye Exam     Visual Acuity (ETDRS)       Right Left   Dist [place] 20/50 20/25 -1   Dist ph [place] 20/30 -1          Tonometry (Tonopen, 1:14 PM)       Right Left   Pressure 13 14         Pupils       Pupils Dark Light Shape   Right PERRL 7 7 Round   Left PERRL 7 7 Round         Visual Fields (Counting fingers)       Left Right     Full Full         Dilation     Right eye: 1.0% Mydriacyl, 2.5% Phenylephrine @ 1:14 PM           Slit Lamp and Fundus Exam     External Exam       Right Left   External Normal Normal         Slit Lamp Exam       Right Left   Lids/Lashes Normal Normal   Conjunctiva/Sclera White and quiet White and quiet   Cornea Clear Clear   Anterior Chamber Deep and quiet Deep and quiet   Iris Round and reactive Round and reactive   Lens 1+ Nuclear sclerosis 1+ Nuclear sclerosis   Anterior Vitreous Normal, vitreous veils anteriorly, no cells Normal         Fundus Exam       Right Left   Posterior Vitreous Posterior vitreous detachment, Pre-retinal hemorrhage small layered inferiorly.  Also localized over retinal break superotemporal Posterior vitreous detachment   Disc Normal Normal   C/D Ratio 0.25    Macula Normal Normal   Vessels Normal Normal   Periphery Retinal tear, operculated, with obvious partial transection of retinal vein superotemporal quadrant 10:00 meridian at the equator Normal            IMAGING AND PROCEDURES  Imaging and Procedures for 04/23/21  OCT, Retina - OU - Both Eyes       Right Eye Quality was good. Scan locations included subfoveal. Central Foveal Thickness: 288. Progression has no prior data. Findings include normal foveal contour.   Left Eye Quality was good. Scan locations included subfoveal. Central Foveal Thickness: 282. Progression has no prior data. Findings include normal foveal contour.   Notes Incidental posterior vitreous detachment with perifoveal operculum OD, no macular hole     Color Fundus Photography Optos - OU - Both Eyes       Right Eye Disc findings include normal observations. Macula : normal observations.   Left Eye Progression has no prior data. Disc findings  include normal observations. Macula : normal observations. Vessels : normal observations. Periphery : normal observations.   Notes Small dot  hemorrhage along the superotemporal venous arcade near the equator superotemporally 10:00 meridian adjacent to the retinal vein secondary to operculated retinal tear with operculum hovering in this region, OD  Posterior vitreous detachment also noted right eye.  Small vitreous hemorrhage layering inferiorly.  OS, incidental posterior vitreous detachment, retinal periphery is entirely normal.     Repair Retinal Breaks, Laser - OD - Right Eye       Tear locations include superior, temporal.   Time Out Confirmed correct patient, procedure, site, and patient consented.   Anesthesia Topical anesthesia was used. Anesthetic medications included Proparacaine 0.5%.   Laser Information The type of laser was diode. Color was yellow. The duration in seconds was 0.03. The spot size was 390 microns. Laser power was 300. Total spots was 178.   Post-op The patient tolerated the procedure well. There were no complications. The patient received written and verbal post procedure care education.   Notes Operculated tear superotemporally along in a straddle retinal vein.  125 with laser with spacing, 3 rows applied, medium intensity laser burns confirmed             ASSESSMENT/PLAN:  Operculated retinal tear of right eye The nature of retinal tears was discussed with the patient. Treatment options including laser retinopexy versus cryotherapy was discussed with the patient as well, as possible side effects including possible failure to prevent retinal detachment.  Other tears in other meridians or at the same site of repair could develop as the vitreous gel continues to contract and exert traction over time. All the patient's questions were answered. An informational note  was given to the patient.  Operculated retinal tear superotemporal near the equator 10:00 meridian OD with an adjacent retinal vein rupture triggering small vitreous hemorrhage.  Laser retinopexy be applied to this area in  order to diminish the small risk of progression to retinal detachment in the absence of ongoing traction.  Vitreous hemorrhage of right eye (HCC) Mild vitreous hemorrhage secondary to operculated retinal tear.  I explained to the patient that the floaters that she now sees will not improve immediately upon laser retinopexy and they typically slowly clear over the next 3 to 8 weeks.  Often times some residual floaters remain     ICD-10-CM   1. Operculated retinal tear of right eye  H33.311 Color Fundus Photography Optos - OU - Both Eyes    Repair Retinal Breaks, Laser - OD - Right Eye    2. Vitreous hemorrhage of right eye (HCC)  H43.11 OCT, Retina - OU - Both Eyes    Color Fundus Photography Optos - OU - Both Eyes      1.  Risk and benefits reviewed of using laser retinopexy in the right eye to diminish the risk of retinal tear detachment to nearly 0 from the causative retinal tear today.  I will explained to the patient and family that other retinal holes or tears could develop but that is very unlikely  2.  Laser retinopexy for retinal hole will be offered today  3.  Patient instructed to contact the office for new onset new floaters that are above and beyond much worse than the current symptomatology  Ophthalmic Meds Ordered this visit:  No orders of the defined types were placed in this encounter.      Return in about 8 weeks (around 06/18/2021) for dilate, COLOR  FP, OD, POST OP.  There are no Patient Instructions on file for this visit.   Explained the diagnoses, plan, and follow up with the patient and they expressed understanding.  Patient expressed understanding of the importance of proper follow up care.   Clent Demark Debhora Titus M.D. Diseases & Surgery of the Retina and Vitreous Retina & Diabetic [place] 04/23/21     Abbreviations: M myopia (nearsighted); A astigmatism; H hyperopia (farsighted); P presbyopia; Mrx spectacle prescription;  CTL contact lenses; OD right eye;  OS left eye; OU both eyes  XT exotropia; ET esotropia; PEK punctate epithelial keratitis; PEE punctate epithelial erosions; DES dry eye syndrome; MGD meibomian gland dysfunction; ATs artificial tears; PFAT's preservative free artificial tears; [place] nuclear sclerotic cataract; PSC posterior subcapsular cataract; ERM epi-retinal membrane; PVD posterior vitreous detachment; RD retinal detachment; DM diabetes mellitus; DR diabetic retinopathy; NPDR non-proliferative diabetic retinopathy; PDR proliferative diabetic retinopathy; CSME clinically significant macular edema; DME diabetic macular edema; dbh dot blot hemorrhages; CWS cotton wool spot; POAG primary open angle glaucoma; C/D cup-to-disc ratio; HVF humphrey visual field; GVF goldmann visual field; OCT optical coherence tomography; IOP intraocular pressure; BRVO Branch retinal vein occlusion; CRVO central retinal vein occlusion; CRAO central retinal artery occlusion; BRAO branch retinal artery occlusion; RT retinal tear; SB scleral buckle; PPV pars plana vitrectomy; VH Vitreous hemorrhage; PRP panretinal laser photocoagulation; IVK intravitreal kenalog; VMT vitreomacular traction; MH Macular hole;  NVD neovascularization of the disc; NVE neovascularization elsewhere; AREDS age related eye disease study; ARMD age related macular degeneration; POAG primary open angle glaucoma; EBMD epithelial/anterior basement membrane dystrophy; ACIOL anterior chamber intraocular lens; IOL intraocular lens; PCIOL posterior chamber intraocular lens; Phaco/IOL phacoemulsification with intraocular lens placement; [place] photorefractive keratectomy; LASIK laser assisted in situ keratomileusis; HTN hypertension; DM diabetes mellitus; COPD chronic obstructive pulmonary disease

## 2021-04-23 NOTE — Assessment & Plan Note (Signed)
Mild vitreous hemorrhage secondary to operculated retinal tear.  I explained to the patient that the floaters that she now sees will not improve immediately upon laser retinopexy and they typically slowly clear over the next 3 to 8 weeks.  Often times some residual floaters remain

## 2021-04-23 NOTE — Assessment & Plan Note (Signed)
The nature of retinal tears was discussed with the patient. Treatment options including laser retinopexy versus cryotherapy was discussed with the patient as well, as possible side effects including possible failure to prevent retinal detachment.  Other tears in other meridians or at the same site of repair could develop as the vitreous gel continues to contract and exert traction over time. All the patient's questions were answered. An informational note  was given to the patient.  Operculated retinal tear superotemporal near the equator 10:00 meridian OD with an adjacent retinal vein rupture triggering small vitreous hemorrhage.  Laser retinopexy be applied to this area in order to diminish the small risk of progression to retinal detachment in the absence of ongoing traction.

## 2021-04-23 NOTE — ED Triage Notes (Signed)
Patient reports to the ER for visual changes. Patient reports Sunday she started seeing lightning bolts in her vision and it went away. Patient states today she started seeing streaks of black in her vision and has black spots in her eye on the right side. Patient reports she has no pain in her head or in the eye.

## 2021-05-08 ENCOUNTER — Encounter: Payer: Self-pay | Admitting: Family Medicine

## 2021-05-08 ENCOUNTER — Other Ambulatory Visit (HOSPITAL_COMMUNITY): Payer: Self-pay

## 2021-05-08 ENCOUNTER — Ambulatory Visit: Payer: 59

## 2021-05-08 DIAGNOSIS — D729 Disorder of white blood cells, unspecified: Secondary | ICD-10-CM

## 2021-05-08 MED FILL — Finasteride Tab 5 MG: ORAL | 90 days supply | Qty: 90 | Fill #1 | Status: AC

## 2021-05-09 NOTE — Telephone Encounter (Signed)
Recheck in 1 mo

## 2021-05-13 ENCOUNTER — Telehealth: Payer: Self-pay | Admitting: *Deleted

## 2021-05-13 ENCOUNTER — Encounter: Payer: Self-pay | Admitting: Family Medicine

## 2021-05-13 DIAGNOSIS — M8580 Other specified disorders of bone density and structure, unspecified site: Secondary | ICD-10-CM

## 2021-05-13 DIAGNOSIS — Z780 Asymptomatic menopausal state: Secondary | ICD-10-CM

## 2021-05-13 NOTE — Telephone Encounter (Signed)
Pt requested Dexa scan be ordered by [facility] only due to Focus plan.

## 2021-05-15 ENCOUNTER — Other Ambulatory Visit: Payer: No Typology Code available for payment source

## 2021-05-15 ENCOUNTER — Ambulatory Visit (INDEPENDENT_AMBULATORY_CARE_PROVIDER_SITE_OTHER): Payer: No Typology Code available for payment source

## 2021-05-15 ENCOUNTER — Other Ambulatory Visit: Payer: Self-pay

## 2021-05-15 DIAGNOSIS — Z780 Asymptomatic menopausal state: Secondary | ICD-10-CM | POA: Diagnosis not present

## 2021-05-15 DIAGNOSIS — M8580 Other specified disorders of bone density and structure, unspecified site: Secondary | ICD-10-CM

## 2021-05-16 ENCOUNTER — Other Ambulatory Visit: Payer: Self-pay | Admitting: Family Medicine

## 2021-05-16 NOTE — Progress Notes (Signed)
Bone density shows osteopenia, low bone mass, your T score has worsened a bit from last DEXA in 2019. Make sure you continue to take vitamin D at least 1000 units daily and calcium '1300mg'$  daily(or eat 4 servings of dairy a day) as well as exercise to work on bone density and prevent osteoporosis. Recheck in 2 years.

## 2021-05-20 ENCOUNTER — Other Ambulatory Visit: Payer: Self-pay

## 2021-05-20 ENCOUNTER — Ambulatory Visit
Admission: RE | Admit: 2021-05-20 | Discharge: 2021-05-20 | Disposition: A | Discharge status: Home / Self Care | Payer: No Typology Code available for payment source | Source: Ambulatory Visit | Attending: Family Medicine | Admitting: Family Medicine

## 2021-05-20 DIAGNOSIS — Z1231 Encounter for screening mammogram for malignant neoplasm of breast: Secondary | ICD-10-CM

## 2021-06-07 ENCOUNTER — Other Ambulatory Visit (HOSPITAL_COMMUNITY): Payer: Self-pay

## 2021-06-17 ENCOUNTER — Telehealth: Payer: No Typology Code available for payment source | Admitting: Physician Assistant

## 2021-06-17 DIAGNOSIS — R3989 Other symptoms and signs involving the genitourinary system: Secondary | ICD-10-CM

## 2021-06-17 MED ORDER — CEPHALEXIN 500 MG PO CAPS
500.0000 mg | ORAL_CAPSULE | Freq: Two times a day (BID) | ORAL | 0 refills | Status: DC
  Filled 2021-06-17: qty 14, 7d supply, fill #0

## 2021-06-17 NOTE — Progress Notes (Signed)

## 2021-06-18 ENCOUNTER — Other Ambulatory Visit (HOSPITAL_COMMUNITY): Payer: Self-pay

## 2021-06-18 ENCOUNTER — Ambulatory Visit (INDEPENDENT_AMBULATORY_CARE_PROVIDER_SITE_OTHER): Payer: No Typology Code available for payment source | Admitting: Ophthalmology

## 2021-06-18 ENCOUNTER — Encounter (INDEPENDENT_AMBULATORY_CARE_PROVIDER_SITE_OTHER): Payer: Self-pay | Admitting: Ophthalmology

## 2021-06-18 ENCOUNTER — Other Ambulatory Visit: Payer: Self-pay

## 2021-06-18 DIAGNOSIS — H33311 Horseshoe tear of retina without detachment, right eye: Secondary | ICD-10-CM | POA: Diagnosis not present

## 2021-06-18 NOTE — Progress Notes (Signed)
06/18/2021     CHIEF COMPLAINT Patient presents for  Chief Complaint  Patient presents with   Retina Follow Up      HISTORY OF PRESENT ILLNESS: Natalie David is a 60 y.o. female who presents to the clinic today for:   HPI     Retina Follow Up   Patient presents with  Other.  In right eye.  This started 8 weeks ago.  Duration of 8 weeks.  Since onset it is stable.        Comments   8 week fu post retinopexy OD (04/23/2021) /fp Pt states VA OU stable since last visit. Pt denies FOL, floaters, or ocular pain OU.  Pt states, "I may see what I think is gnat every once in a while but nothing at all like I experienced before."       Last edited by Kendra Opitz, COA on 06/18/2021  3:57 PM.      Referring physician: Darleen Crocker, MD [address] [place],  [place] [address]  HISTORICAL INFORMATION:   Selected notes from the MEDICAL RECORD NUMBER    Lab Results  Component Value Date   HGBA1C 5.2 03/16/2019     CURRENT MEDICATIONS: No current outpatient medications on file. (Ophthalmic Drugs)   No current facility-administered medications for this visit. (Ophthalmic Drugs)   Current Outpatient Medications (Other)  Medication Sig   Calcium Carb-Cholecalciferol 600-800 MG-UNIT TABS Take 1 tablet by mouth 2 (two) times daily.   cephALEXin (KEFLEX) 500 MG capsule Take 1 capsule (500 mg total) by mouth 2 (two) times daily.   finasteride (PROSCAR) 5 MG tablet TAKE 1 TABLET (5 MG TOTAL) BY MOUTH DAILY.   metoprolol succinate (TOPROL-XL) 25 MG 24 hr tablet Take 1 tablet (25 mg total) by mouth daily.   minoxidil (ROGAINE) 2 % external solution Apply topically daily.    Multiple Vitamins-Minerals (WOMENS MULTIVITAMIN PLUS PO) Take 1 capsule by mouth daily.   Omega-3 Fatty Acids (FISH OIL) 1000 MG CAPS Take by mouth 2 (two) times daily.   Prasterone (INTRAROSA) 6.5 MG INST Place 1 insert (6.5 mg) vaginally at bedtime.   No current facility-administered  medications for this visit. (Other)      REVIEW OF SYSTEMS:    ALLERGIES Allergies  Allergen Reactions   Codeine Nausea Only    PAST MEDICAL HISTORY Past Medical History:  Diagnosis Date   Osteoporosis    Tachycardia    Past Surgical History:  Procedure Laterality Date   AUGMENTATION MAMMAPLASTY     BLEPHAROPLASTY  2011   BREAST ENHANCEMENT SURGERY  2010   COLONOSCOPY     LAPAROSCOPY     for endometriosis   LAPAROTOMY     Rt ectopic   LYMPH NODE BIOPSY     Rt underarm   NASAL SEPTUM SURGERY      FAMILY HISTORY Family History  Problem Relation Age of Onset   Heart disease Father    Hypertension Father    Thyroid disease Father    Aortic aneurysm Father    Cirrhosis Father        Medication side effect.    Hypertension Sister    Thyroid disease Other    Hyperlipidemia Daughter    Hyperlipidemia Sister    Kidney disease Mother    Aortic aneurysm Mother    COPD Mother        worked in Research scientist (life sciences) cancer Neg Hx    Esophageal cancer  Neg Hx    Rectal cancer Neg Hx    Stomach cancer Neg Hx    Breast cancer Neg Hx     SOCIAL HISTORY Social History   Tobacco Use   Smoking status: Never   Smokeless tobacco: Never  Vaping Use   Vaping Use: Never used  Substance Use Topics   Alcohol use: Yes    Alcohol/week: 3.0 standard drinks    Types: 3 Glasses of wine per week   Drug use: No         OPHTHALMIC EXAM:  Base Eye Exam     Visual Acuity (ETDRS)       Right Left   Dist [place] 20/30    Dist ph [place] 20/20 -1          Tonometry (Tonopen, 4:03 PM)       Right Left   Pressure 12 11         Pupils       Pupils   Right PERRL   Left PERRL         Visual Fields (Counting fingers)       Left Right    Full Full         Extraocular Movement       Right Left    Full Full         Neuro/Psych     Oriented x3: Yes         Dilation     Right eye: 1.0% Mydriacyl, 2.5% Phenylephrine @ 4:03 PM           Slit  Lamp and Fundus Exam     External Exam       Right Left   External Normal Normal         Slit Lamp Exam       Right Left   Lids/Lashes Normal Normal   Conjunctiva/Sclera White and quiet White and quiet   Cornea Clear Clear   Anterior Chamber Deep and quiet Deep and quiet   Iris Round and reactive Round and reactive   Lens 1+ Nuclear sclerosis 1+ Nuclear sclerosis   Anterior Vitreous Normal, vitreous veils anteriorly, no cells Normal         Fundus Exam       Right Left   Posterior Vitreous Posterior vitreous detachment,    Disc Normal    C/D Ratio 0.25    Macula Normal    Vessels Normal    Periphery Retinal tear, operculated, with obvious partial transection of retinal vein superotemporal quadrant 10:00 meridian at the equator, with good retinopexy and no new retinal breaks.             IMAGING AND PROCEDURES  Imaging and Procedures for 06/18/21  Color Fundus Photography Optos - OU - Both Eyes       Right Eye Disc findings include normal observations. Macula : normal observations.   Left Eye Progression has no prior data. Disc findings include normal observations. Macula : normal observations. Vessels : normal observations. Periphery : normal observations.   Notes Small dot hemorrhage along the superotemporal venous arcade near the equator superotemporally 10:00 meridian adjacent to the retinal vein secondary to operculated retinal tear with operculum hovering in this region, OD, now with good retinopexy  Posterior vitreous detachment also noted right eye.  No new hemorrhage and no residual hemorrhage seen, incidental posterior vitreous detachment visible OS, incidental posterior vitreous detachment, retinal periphery is entirely normal.  ASSESSMENT/PLAN:  No problem-specific Assessment & Plan notes found for this encounter.      ICD-10-CM   1. Operculated retinal tear of right eye  H33.311 Color Fundus Photography Optos - OU - Both  Eyes      1.  OD, good retinopexy delivered some 10 weeks previous.  No new retinal breaks observe  2.  To report any new onset visual acuity declines or distortion  3.  Ophthalmic Meds Ordered this visit:  No orders of the defined types were placed in this encounter.      Return in about 1 year (around 06/18/2022) for COLOR FP, DILATE OU.  There are no Patient Instructions on file for this visit.   Explained the diagnoses, plan, and follow up with the patient and they expressed understanding.  Patient expressed understanding of the importance of proper follow up care.   Clent Demark Zackarie Chason M.D. Diseases & Surgery of the Retina and Vitreous Retina [facility] Diabetic [place] 06/18/21     Abbreviations: M myopia (nearsighted); A astigmatism; H hyperopia (farsighted); P presbyopia; Mrx spectacle prescription;  CTL contact lenses; OD right eye; OS left eye; OU both eyes  XT exotropia; ET esotropia; PEK punctate epithelial keratitis; PEE punctate epithelial erosions; DES dry eye syndrome; MGD meibomian gland dysfunction; ATs artificial tears; PFAT's preservative free artificial tears; [place] nuclear sclerotic cataract; PSC posterior subcapsular cataract; ERM epi-retinal membrane; PVD posterior vitreous detachment; RD retinal detachment; DM diabetes mellitus; DR diabetic retinopathy; NPDR non-proliferative diabetic retinopathy; PDR proliferative diabetic retinopathy; CSME clinically significant macular edema; DME diabetic macular edema; dbh dot blot hemorrhages; CWS cotton wool spot; POAG primary open angle glaucoma; C/D cup-to-disc ratio; HVF humphrey visual field; GVF goldmann visual field; OCT optical coherence tomography; IOP intraocular pressure; BRVO Branch retinal vein occlusion; CRVO central retinal vein occlusion; CRAO central retinal artery occlusion; BRAO branch retinal artery occlusion; RT retinal tear; SB scleral buckle; PPV pars plana vitrectomy; VH Vitreous hemorrhage; PRP panretinal laser  photocoagulation; IVK intravitreal kenalog; VMT vitreomacular traction; MH Macular hole;  NVD neovascularization of the disc; NVE neovascularization elsewhere; AREDS age related eye disease study; ARMD age related macular degeneration; POAG primary open angle glaucoma; EBMD epithelial/anterior basement membrane dystrophy; ACIOL anterior chamber intraocular lens; IOL intraocular lens; PCIOL posterior chamber intraocular lens; Phaco/IOL phacoemulsification with intraocular lens placement; [place] photorefractive keratectomy; LASIK laser assisted in situ keratomileusis; HTN hypertension; DM diabetes mellitus; COPD chronic obstructive pulmonary disease

## 2021-06-26 ENCOUNTER — Encounter: Payer: Self-pay | Admitting: Family Medicine

## 2021-06-26 DIAGNOSIS — D729 Disorder of white blood cells, unspecified: Secondary | ICD-10-CM

## 2021-06-28 LAB — CBC WITH DIFFERENTIAL/PLATELET
Absolute Monocytes: 290 cells/uL (ref 200–950)
Basophils Absolute: 40 cells/uL (ref 0–200)
Basophils Relative: 0.9 %
Eosinophils Absolute: 70 cells/uL (ref 15–500)
Eosinophils Relative: 1.6 %
HCT: 33.2 % — ABNORMAL LOW (ref 35.0–45.0)
Hemoglobin: 11.1 g/dL — ABNORMAL LOW (ref 11.7–15.5)
Lymphs Abs: 2284 cells/uL (ref 850–3900)
MCH: 33 pg (ref 27.0–33.0)
MCHC: 33.4 g/dL (ref 32.0–36.0)
MCV: 98.8 fL (ref 80.0–100.0)
MPV: 10.6 fL (ref 7.5–12.5)
Monocytes Relative: 6.6 %
Neutro Abs: 1716 cells/uL (ref 1500–7800)
Neutrophils Relative %: 39 %
Platelets: 160 10*3/uL (ref 140–400)
RBC: 3.36 10*6/uL — ABNORMAL LOW (ref 3.80–5.10)
RDW: 11.6 % (ref 11.0–15.0)
Total Lymphocyte: 51.9 %
WBC: 4.4 10*3/uL (ref 3.8–10.8)

## 2021-06-28 NOTE — Progress Notes (Signed)
Hemoglobin has dropped from 12.4 to 11.12 months ago.  Please call the lab and see if we can add an iron panel.  Also please call the patient and see if she has noticed any blood in her urine or stool.  The good news is that her white blood cell count is back to normal so that is not an issue.  But I am concerned about the drop in hemoglobin over a short period of time.

## 2021-07-03 ENCOUNTER — Other Ambulatory Visit (HOSPITAL_COMMUNITY): Payer: Self-pay

## 2021-07-03 MED ORDER — HYDROCODONE-ACETAMINOPHEN 5-325 MG PO TABS
1.0000 | ORAL_TABLET | Freq: Four times a day (QID) | ORAL | 0 refills | Status: DC | PRN
  Filled 2021-07-03: qty 15, 4d supply, fill #0

## 2021-07-03 MED ORDER — DOXYCYCLINE MONOHYDRATE 100 MG PO CAPS
100.0000 mg | ORAL_CAPSULE | Freq: Two times a day (BID) | ORAL | 0 refills | Status: DC
  Filled 2021-07-03: qty 4, 2d supply, fill #0

## 2021-07-04 ENCOUNTER — Other Ambulatory Visit (HOSPITAL_COMMUNITY): Payer: Self-pay

## 2021-07-05 ENCOUNTER — Other Ambulatory Visit (HOSPITAL_COMMUNITY): Payer: Self-pay

## 2021-07-29 ENCOUNTER — Ambulatory Visit: Payer: Self-pay | Attending: Internal Medicine

## 2021-07-29 ENCOUNTER — Other Ambulatory Visit (HOSPITAL_BASED_OUTPATIENT_CLINIC_OR_DEPARTMENT_OTHER): Payer: Self-pay

## 2021-07-29 ENCOUNTER — Encounter: Payer: Self-pay | Admitting: Family Medicine

## 2021-07-29 DIAGNOSIS — Z23 Encounter for immunization: Secondary | ICD-10-CM

## 2021-07-29 NOTE — Progress Notes (Signed)
   Covid-19 Vaccination Clinic  Name:  Natalie David    MRN: [id number] DOB: [birth year]  07/29/2021  Natalie David was observed post Covid-19 immunization for 15 minutes without incident. She was provided with Vaccine Information Sheet and instruction to access the V-Safe system.   Natalie David was instructed to call 911 with any severe reactions post vaccine: Difficulty breathing  Swelling of face and throat  A fast heartbeat  A bad rash all over body  Dizziness and weakness

## 2021-08-12 ENCOUNTER — Other Ambulatory Visit (HOSPITAL_COMMUNITY): Payer: Self-pay

## 2021-08-12 MED FILL — Finasteride Tab 5 MG: ORAL | 90 days supply | Qty: 90 | Fill #2 | Status: AC

## 2021-08-26 ENCOUNTER — Other Ambulatory Visit (HOSPITAL_BASED_OUTPATIENT_CLINIC_OR_DEPARTMENT_OTHER): Payer: Self-pay

## 2021-08-26 MED ORDER — PFIZER COVID-19 VAC BIVALENT 30 MCG/0.3ML IM SUSP
INTRAMUSCULAR | 0 refills | Status: DC
  Filled 2021-08-26: qty 0.3, 1d supply, fill #0

## 2021-09-21 IMAGING — MG DIGITAL SCREENING BREAST BILAT IMPLANT W/ TOMO W/ CAD
9 of 12 series · 9 of 28 positions shown · non-contrast
Comparison: Prior films

CLINICAL DATA: Screening.

EXAM:
DIGITAL SCREENING BILATERAL MAMMOGRAM WITH IMPLANTS, CAD AND
TOMOSYNTHESIS
TECHNIQUE: Bilateral screening digital craniocaudal and mediolateral oblique
mammograms were obtained. Bilateral screening digital breast
tomosynthesis was performed. The images were evaluated with
computer-aided detection. Standard and/or implant displaced views
were performed.

[R MLO]
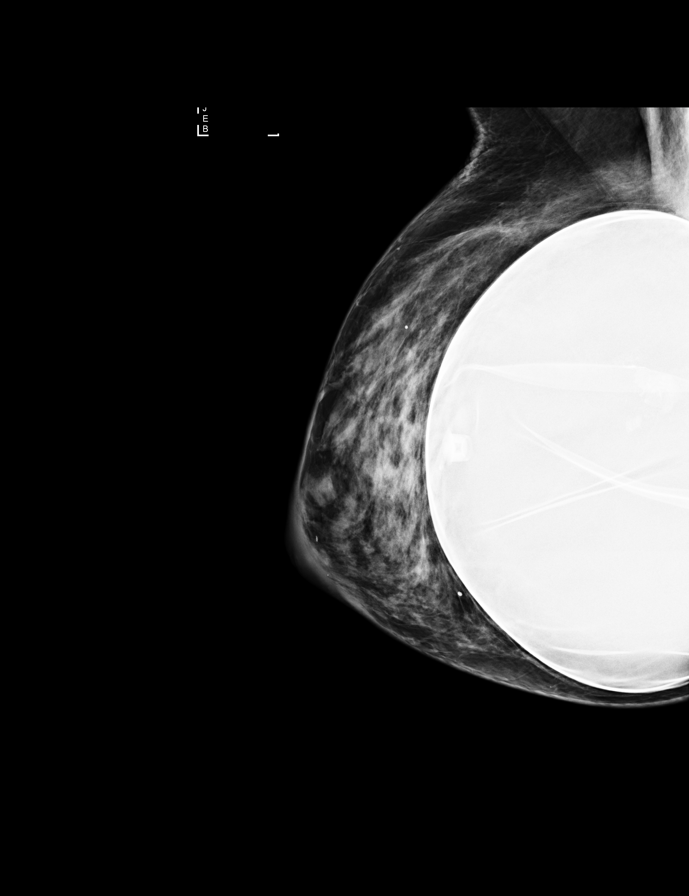

[L CC]
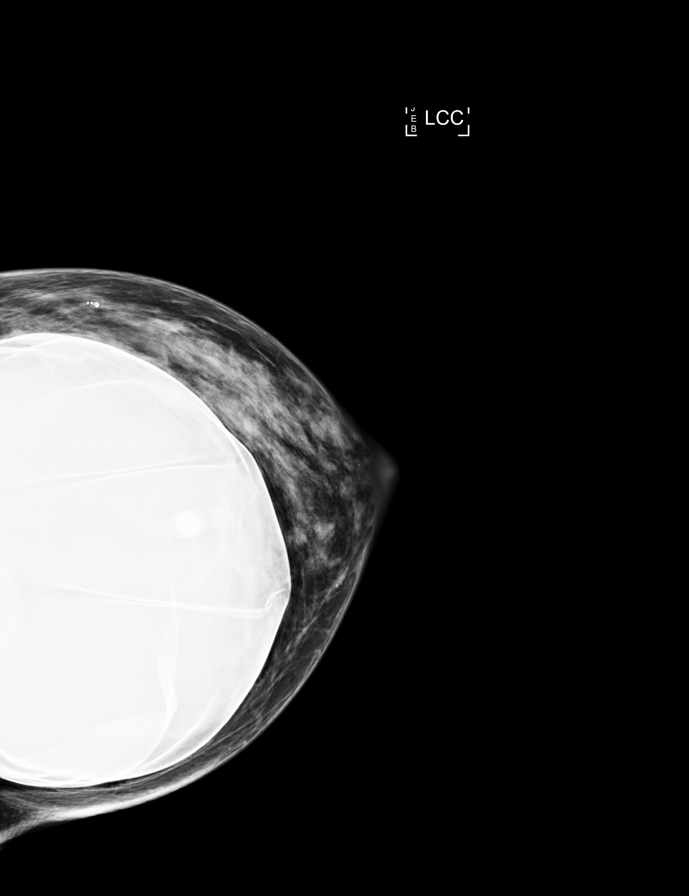

[L MLO]
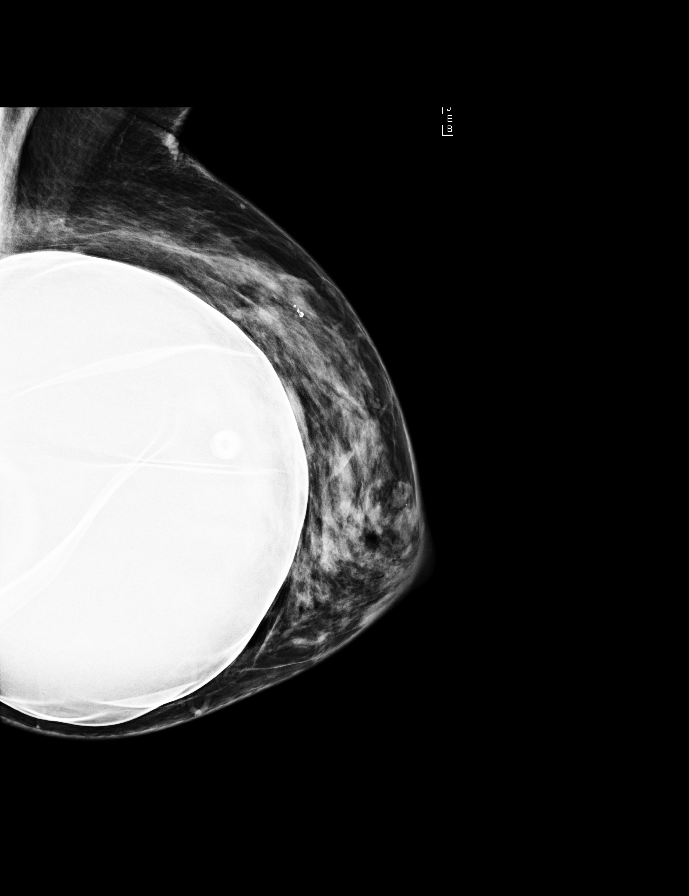

[R CC]
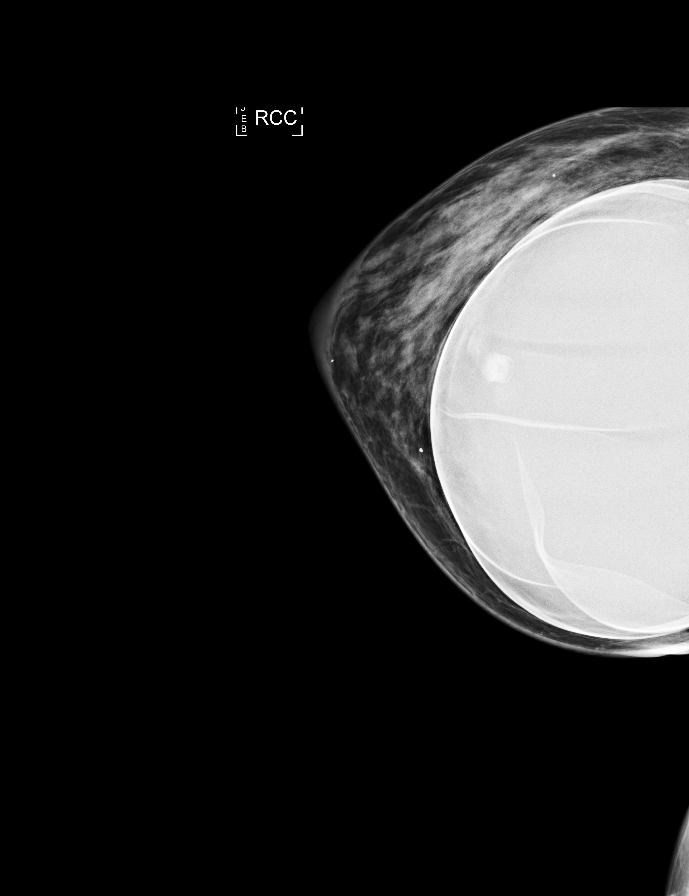

[L MLO synth-2D]
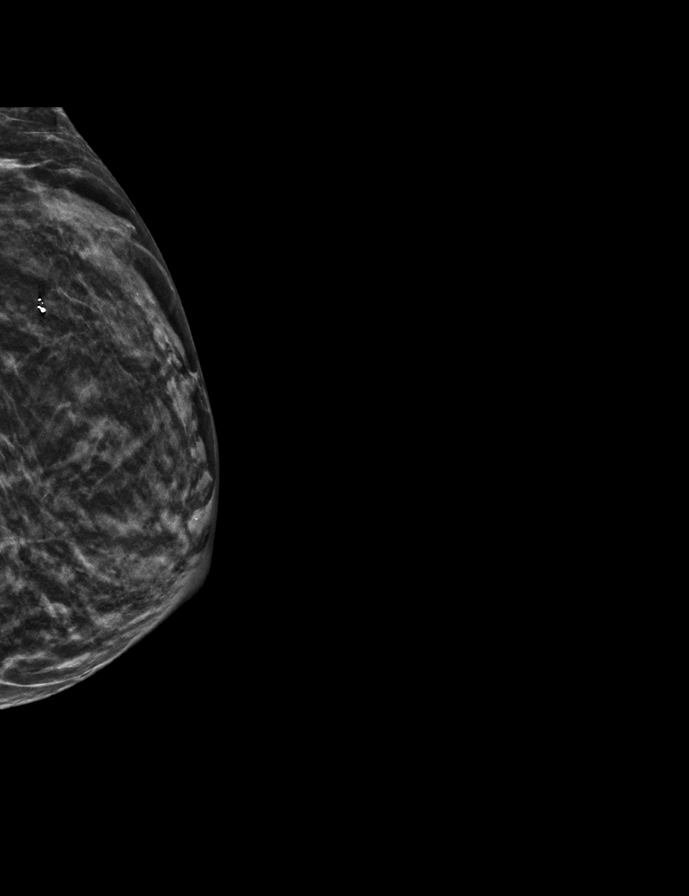

[R MLO synth-2D]
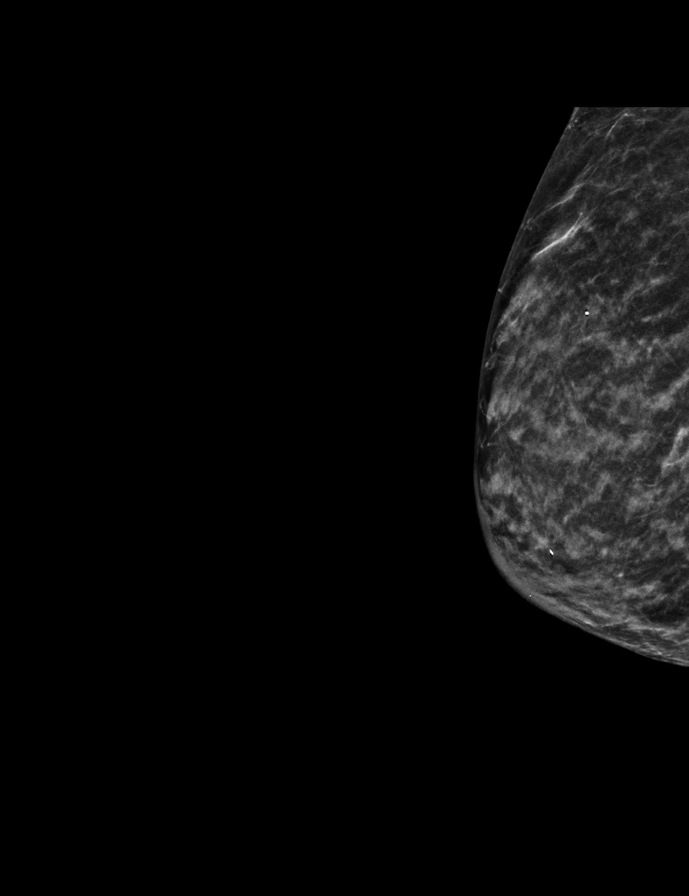

[R CC synth-2D]
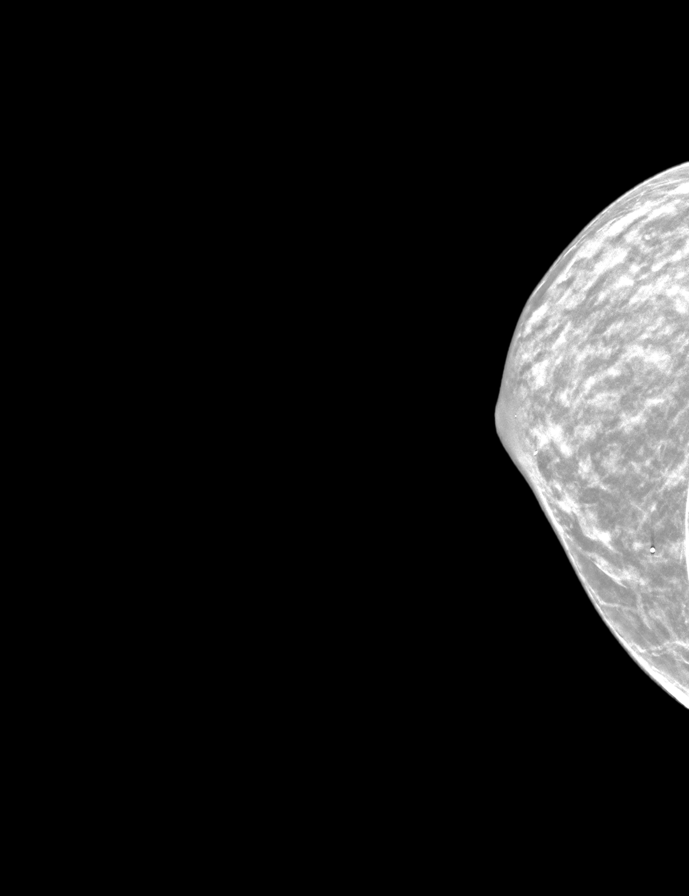

[L CC synth-2D]
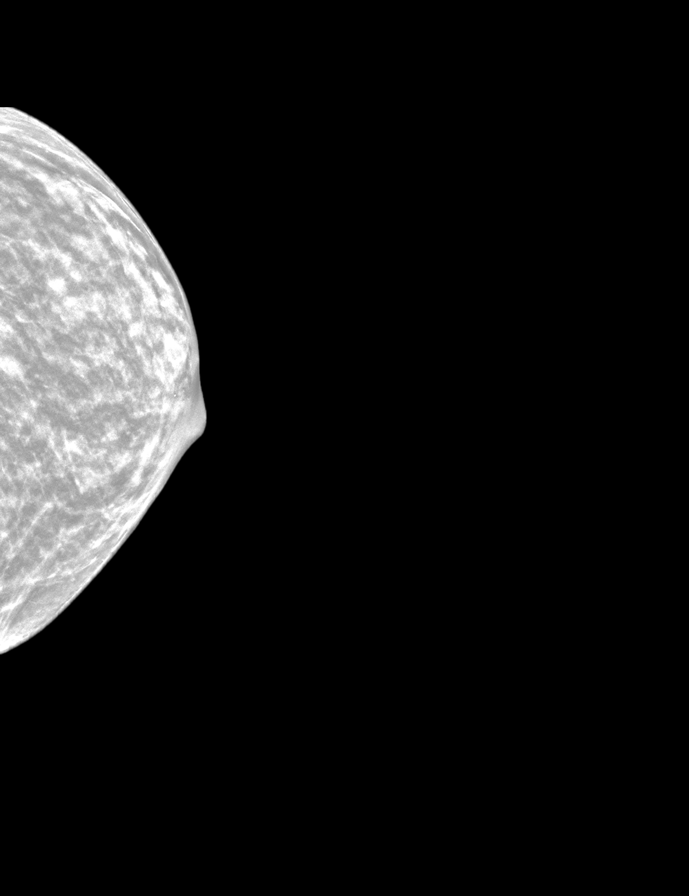

[L MLOID BREAST TOMOSYNTHESIS IMAGE tomo · tomo slice 18/35.0]
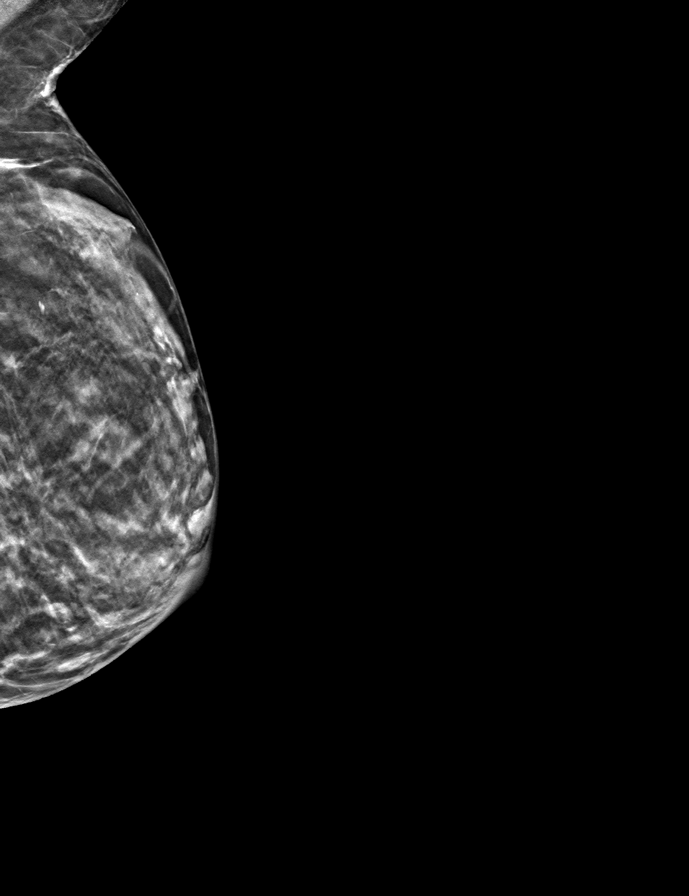

[9 of 28 positions shown; findings below may reference images not displayed]

ACR Breast Density Category c: The breast tissue is heterogeneously
dense, which may obscure small masses.
FINDINGS: The patient has implants. There are no findings suspicious for
malignancy.
IMPRESSION: No mammographic evidence of malignancy. A result letter of this
screening mammogram will be mailed directly to the patient.

RECOMMENDATION:
Screening mammogram in one year. (Code:T1-X-THI)

BI-RADS CATEGORY  1:  Negative.

## 2021-10-30 ENCOUNTER — Encounter: Payer: Self-pay | Admitting: Family Medicine

## 2021-10-30 DIAGNOSIS — N1831 Chronic kidney disease, stage 3a: Secondary | ICD-10-CM

## 2021-11-01 LAB — RENAL FUNCTION PANEL
Albumin: 4.5 g/dL (ref 3.6–5.1)
BUN: 16 mg/dL (ref 7–25)
CO2: 29 mmol/L (ref 20–32)
Calcium: 9.3 mg/dL (ref 8.6–10.4)
Chloride: 99 mmol/L (ref 98–110)
Creat: 1.03 mg/dL (ref 0.50–1.05)
Glucose, Bld: 94 mg/dL (ref 65–139)
Phosphorus: 3.3 mg/dL (ref 2.5–4.5)
Potassium: 3.8 mmol/L (ref 3.5–5.3)
Sodium: 135 mmol/L (ref 135–146)

## 2021-11-01 NOTE — Progress Notes (Signed)
Labs look good.

## 2021-11-01 NOTE — Progress Notes (Signed)
Hi Natalie David, kidney function looks good at 1.0 so its been stable over the last 3 years at least.

## 2021-11-07 ENCOUNTER — Other Ambulatory Visit (HOSPITAL_COMMUNITY): Payer: Self-pay

## 2021-11-07 MED ORDER — FINASTERIDE 5 MG PO TABS
5.0000 mg | ORAL_TABLET | Freq: Every day | ORAL | 3 refills | Status: DC
  Filled 2021-11-07: qty 90, 90d supply, fill #0
  Filled 2022-02-05: qty 90, 90d supply, fill #1
  Filled 2022-05-04: qty 90, 90d supply, fill #2
  Filled 2022-08-04: qty 90, 90d supply, fill #3

## 2021-11-11 ENCOUNTER — Other Ambulatory Visit (HOSPITAL_COMMUNITY): Payer: Self-pay

## 2021-11-12 ENCOUNTER — Other Ambulatory Visit (HOSPITAL_COMMUNITY): Payer: Self-pay

## 2021-11-14 ENCOUNTER — Telehealth: Payer: Self-pay | Admitting: Family Medicine

## 2021-11-14 NOTE — Telephone Encounter (Signed)
Please call patient: Received note from dermatology at [facility].  They are recommending oral minoxidil.  I think it would be okay to take.  I would have her hold the metoprolol completely.  Then monitor her blood pressures for 2 weeks.    If she does not have a home blood pressure cuff and we can have her come in here for nurse visit after 2 weeks just to make sure that the blood pressure looks good.  And to see if we may need to add back the metoprolol.  Let me know if she needs me to send over prescription for the oral minoxidil or if they are.

## 2021-11-14 NOTE — Telephone Encounter (Signed)
OK sounds like a plan.  He is welcome to send me some of those blood pressures in a couple weeks and let me know how she is doing

## 2021-11-14 NOTE — Telephone Encounter (Signed)
Natalie David is not comfortable with holding her metoprolol for 2 weeks. She states she is worried about her pulse going up. She states she is ok with monitoring her blood pressure and take a low dose of minoxidil. Please advise.

## 2021-11-15 NOTE — Telephone Encounter (Signed)
Patient advised of recommendations.  

## 2021-11-23 ENCOUNTER — Other Ambulatory Visit (HOSPITAL_COMMUNITY): Payer: Self-pay

## 2021-11-23 MED ORDER — MINOXIDIL 2.5 MG PO TABS
ORAL_TABLET | ORAL | 1 refills | Status: DC
  Filled 2021-11-23: qty 45, 90d supply, fill #0
  Filled 2022-02-13: qty 39, 78d supply, fill #1
  Filled 2022-02-13: qty 9, 18d supply, fill #1
  Filled 2022-02-13: qty 36, 72d supply, fill #1
  Filled 2022-02-13: qty 6, 12d supply, fill #1

## 2021-12-12 ENCOUNTER — Encounter: Payer: Self-pay | Admitting: Family Medicine

## 2022-02-05 ENCOUNTER — Other Ambulatory Visit (HOSPITAL_COMMUNITY): Payer: Self-pay

## 2022-02-13 ENCOUNTER — Other Ambulatory Visit (HOSPITAL_COMMUNITY): Payer: Self-pay

## 2022-04-07 ENCOUNTER — Other Ambulatory Visit: Payer: Self-pay | Admitting: Family Medicine

## 2022-04-07 DIAGNOSIS — Z1231 Encounter for screening mammogram for malignant neoplasm of breast: Secondary | ICD-10-CM

## 2022-04-21 ENCOUNTER — Encounter: Payer: Self-pay | Admitting: Family Medicine

## 2022-04-21 ENCOUNTER — Ambulatory Visit (INDEPENDENT_AMBULATORY_CARE_PROVIDER_SITE_OTHER): Payer: No Typology Code available for payment source | Admitting: Family Medicine

## 2022-04-21 ENCOUNTER — Other Ambulatory Visit: Payer: Self-pay

## 2022-04-21 VITALS — BP 104/67 | HR 90 | Ht 68.0 in | Wt 127.0 lb

## 2022-04-21 DIAGNOSIS — Z0000 Encounter for general adult medical examination without abnormal findings: Secondary | ICD-10-CM

## 2022-04-21 NOTE — Progress Notes (Signed)
   Established Patient Office Visit  Subjective   Patient ID: Natalie David, female    DOB: 25-Oct-1960  Age: 61 y.o. MRN: [id number]  Chief Complaint  Patient presents with   Annual Exam    HPI    ROS    Objective:     LMP 12/16/2009    Physical Exam   No results found for any visits on 04/21/22.    The 10-year ASCVD risk score (Arnett DK, et al., 2019) is: 2.8%    Assessment & Plan:   Problem List Items Addressed This Visit   None   No follow-ups on file.    [name], MD

## 2022-04-21 NOTE — Progress Notes (Signed)
Complete physical exam  Patient: Natalie David   DOB: 1961-02-19   61 y.o. Female  MRN: [id number]  Subjective:    Chief Complaint  Patient presents with   Annual Exam    Natalie David is a 61 y.o. female who presents today for a complete physical exam. She reports consuming a general diet.  Walks and rowing for exercise  She generally feels well.. She does have additional problems to discuss today.  She is now retired and is helping take care of her granddaughter 4 days a week.   Most recent fall risk assessment:    04/21/2022    9:26 AM  [place] in the past year? 0  Number falls in past yr: 0  Injury with Fall? 0  Risk for fall due to : No Fall Risks  Follow up Falls prevention discussed     Most recent depression screenings:    04/21/2022    9:26 AM 04/10/2021    8:01 AM  PHQ 2/9 Scores  PHQ - 2 Score 0 0        Patient Care Team: Hali Marry, MD as PCP - General (Family Medicine)   Outpatient Medications Prior to Visit  Medication Sig   bimatoprost (LATISSE) 0.03 % ophthalmic solution PLACE 1 DROP ON APPLICATOR/Q-TIP AND APPLY TO EYEBROWS NIGHTLY   Calcium Carb-Cholecalciferol 600-800 MG-UNIT TABS Take 1 tablet by mouth 2 (two) times daily.   finasteride (PROSCAR) 5 MG tablet Take 1 tablet (5 mg total) by mouth daily.   metoprolol succinate (TOPROL-XL) 25 MG 24 hr tablet Take 1 tablet (25 mg total) by mouth daily.   Multiple Vitamins-Minerals (WOMENS MULTIVITAMIN PLUS PO) Take 1 capsule by mouth daily.   Omega-3 Fatty Acids (FISH OIL) 1000 MG CAPS Take by mouth 2 (two) times daily.   [DISCONTINUED] minoxidil (ROGAINE) 2 % external solution Apply topically daily.    [DISCONTINUED] Prasterone (INTRAROSA) 6.5 MG INST Place 1 insert (6.5 mg) vaginally at bedtime.   No facility-administered medications prior to visit.    ROS        Objective:     BP 104/67   Pulse 90   Ht '5\' 8"'$  (1.727 m)   Wt 127 lb (57.6 kg)    LMP 12/16/2009   SpO2 100%   BMI 19.31 kg/m    Physical Exam Vitals reviewed.  Constitutional:      Appearance: She is well-developed.  HENT:     Head: Normocephalic and atraumatic.     Right Ear: External ear normal.     Left Ear: External ear normal.     Nose: Nose normal.  Eyes:     Conjunctiva/sclera: Conjunctivae normal.     Pupils: Pupils are equal, round, and reactive to light.  Neck:     Thyroid: No thyromegaly.  Cardiovascular:     Rate and Rhythm: Normal rate and regular rhythm.     Heart sounds: Normal heart sounds.  Pulmonary:     Effort: Pulmonary effort is normal.     Breath sounds: Normal breath sounds. No wheezing.  Abdominal:     General: Bowel sounds are normal.     Palpations: Abdomen is soft.     Tenderness: There is no abdominal tenderness.  Musculoskeletal:     Cervical back: Neck supple.  Lymphadenopathy:     Cervical: No cervical adenopathy.  Skin:    General: Skin is warm and dry.     Coloration: Skin is not  pale.  Neurological:     Mental Status: She is alert and oriented to person, place, and time.  Psychiatric:        Mood and Affect: Mood normal.        Behavior: Behavior normal.      No results found for any visits on 04/21/22.     Assessment & Plan:    Routine Health Maintenance and Physical Exam  Immunization History  Administered Date(s) Administered   Influenza,inj,Quad PF,6+ Mos 07/11/2013, 07/04/2014, 07/14/2015   Influenza-Unspecified 07/07/2016, 07/06/2017, 07/02/2018, 07/08/2019, 07/13/2020, 07/05/2021   PFIZER(Purple Top)SARS-COV-2 Vaccination 09/27/2019, 10/18/2019, 07/27/2020   Pfizer Covid-19 Vaccine Bivalent Booster 53yr & up 07/29/2021   Tdap 02/27/2009, 03/16/2019   Zoster Recombinat (Shingrix) 01/29/2017, 06/01/2017    Health Maintenance  Topic Date Due   INFLUENZA VACCINE  05/13/2022   COLONOSCOPY (Pts 45-451yrInsurance coverage will need to be confirmed)  02/12/2023   DEXA SCAN  05/16/2023    MAMMOGRAM  05/21/2023   PAP SMEAR-Modifier  03/15/2024   TETANUS/TDAP  03/15/2029   COVID-19 Vaccine  Completed   Hepatitis C Screening  Completed   HIV Screening  Completed   Zoster Vaccines- Shingrix  Completed   Pneumococcal Vaccine [id number] old  Aged Out   HPV VACCINES  Aged Out    Discussed health benefits of physical activity, and encouraged her to engage in regular exercise appropriate for her age and condition.  Problem List Items Addressed This Visit   None Visit Diagnoses     Wellness examination    -  Primary   Relevant Orders   TSH   CBC   Lipid Panel w/reflex Direct LDL   COMPLETE METABOLIC PANEL WITH GFR   B12   Ferritin      Return in about 1 year (around 04/22/2023) for Wellness Exam.    Keep up a regular exercise program and make sure you are eating a healthy diet Try to eat 4 servings of dairy a day, or if you are lactose intolerant take a calcium with vitamin D daily.  Your vaccines are up to date.  We will get up-to-date labs today. GrPhillip Healcheduled for August.   CaBeatrice LecherMD

## 2022-04-22 LAB — COMPLETE METABOLIC PANEL WITH GFR
AG Ratio: 1.5 (calc) (ref 1.0–2.5)
ALT: 13 U/L (ref 6–29)
AST: 21 U/L (ref 10–35)
Albumin: 4.6 g/dL (ref 3.6–5.1)
Alkaline phosphatase (APISO): 71 U/L (ref 37–153)
BUN/Creatinine Ratio: 15 (calc) (ref 6–22)
BUN: 17 mg/dL (ref 7–25)
CO2: 28 mmol/L (ref 20–32)
Calcium: 9.8 mg/dL (ref 8.6–10.4)
Chloride: 103 mmol/L (ref 98–110)
Creat: 1.1 mg/dL — ABNORMAL HIGH (ref 0.50–1.05)
Globulin: 3 g/dL (calc) (ref 1.9–3.7)
Glucose, Bld: 98 mg/dL (ref 65–99)
Potassium: 4.5 mmol/L (ref 3.5–5.3)
Sodium: 139 mmol/L (ref 135–146)
Total Bilirubin: 0.8 mg/dL (ref 0.2–1.2)
Total Protein: 7.6 g/dL (ref 6.1–8.1)
eGFR: 57 mL/min/{1.73_m2} — ABNORMAL LOW (ref >=60)

## 2022-04-22 LAB — CBC
HCT: 34.4 % — ABNORMAL LOW (ref 35.0–45.0)
Hemoglobin: 11.5 g/dL — ABNORMAL LOW (ref 11.7–15.5)
MCH: 32.5 pg (ref 27.0–33.0)
MCHC: 33.4 g/dL (ref 32.0–36.0)
MCV: 97.2 fL (ref 80.0–100.0)
MPV: 10.6 fL (ref 7.5–12.5)
Platelets: 157 10*3/uL (ref 140–400)
RBC: 3.54 10*6/uL — ABNORMAL LOW (ref 3.80–5.10)
RDW: 11.5 % (ref 11.0–15.0)
WBC: 4.3 10*3/uL (ref 3.8–10.8)

## 2022-04-22 LAB — LIPID PANEL W/REFLEX DIRECT LDL
Cholesterol: 190 mg/dL (ref <200)
HDL: 70 mg/dL (ref >=50)
LDL Cholesterol (Calc): 98 mg/dL (calc)
Non-HDL Cholesterol (Calc): 120 mg/dL (calc) (ref <130)
Total CHOL/HDL Ratio: 2.7 (calc) (ref <5.0)
Triglycerides: 119 mg/dL (ref <150)

## 2022-04-22 LAB — TSH: TSH: 0.89 mIU/L (ref 0.40–4.50)

## 2022-04-22 LAB — VITAMIN B12: Vitamin B-12: 458 pg/mL (ref 200–1100)

## 2022-04-22 LAB — FERRITIN: Ferritin: 152 ng/mL (ref 16–288)

## 2022-04-22 NOTE — Progress Notes (Signed)
Hi Natalie David, hemoglobin actually looks a little bit better than it did 9 months ago it still low but it at least it is up to 11.5.  Kidney function is stable at 1.1.  This still puts you in CKD 3.  Liver enzymes are normal.  Iron stores and B12 look great.  Thyroid is okay.  Very stable.  Cholesterol looks great!

## 2022-05-04 ENCOUNTER — Other Ambulatory Visit: Payer: Self-pay | Admitting: Physician Assistant

## 2022-05-04 DIAGNOSIS — R000 Tachycardia, unspecified: Secondary | ICD-10-CM

## 2022-05-05 ENCOUNTER — Other Ambulatory Visit (HOSPITAL_COMMUNITY): Payer: Self-pay

## 2022-05-05 MED ORDER — METOPROLOL SUCCINATE ER 25 MG PO TB24
25.0000 mg | ORAL_TABLET | Freq: Every day | ORAL | 3 refills | Status: DC
  Filled 2022-05-05: qty 90, 90d supply, fill #0
  Filled 2022-08-04: qty 90, 90d supply, fill #1
  Filled 2022-10-26: qty 90, 90d supply, fill #2
  Filled 2023-01-27: qty 90, 90d supply, fill #3

## 2022-05-28 ENCOUNTER — Other Ambulatory Visit (HOSPITAL_COMMUNITY): Payer: Self-pay

## 2022-05-28 MED ORDER — MINOXIDIL 2.5 MG PO TABS
ORAL_TABLET | ORAL | 1 refills | Status: DC
Start: 2022-05-27 — End: 2022-10-23
  Filled 2022-05-28: qty 45, 90d supply, fill #0
  Filled 2022-08-21: qty 45, 90d supply, fill #1

## 2022-05-30 ENCOUNTER — Ambulatory Visit
Admission: RE | Admit: 2022-05-30 | Discharge: 2022-05-30 | Disposition: A | Discharge status: Home / Self Care | Payer: No Typology Code available for payment source | Source: Ambulatory Visit | Attending: Family Medicine | Admitting: Family Medicine

## 2022-05-30 DIAGNOSIS — Z1231 Encounter for screening mammogram for malignant neoplasm of breast: Secondary | ICD-10-CM

## 2022-05-30 NOTE — Progress Notes (Signed)
Please call patient. Normal mammogram.  Repeat in 1 year.  

## 2022-06-19 ENCOUNTER — Ambulatory Visit (INDEPENDENT_AMBULATORY_CARE_PROVIDER_SITE_OTHER): Payer: No Typology Code available for payment source | Admitting: Ophthalmology

## 2022-06-19 ENCOUNTER — Encounter (INDEPENDENT_AMBULATORY_CARE_PROVIDER_SITE_OTHER): Payer: Self-pay | Admitting: Ophthalmology

## 2022-06-19 DIAGNOSIS — H33311 Horseshoe tear of retina without detachment, right eye: Secondary | ICD-10-CM | POA: Diagnosis not present

## 2022-06-19 DIAGNOSIS — H43811 Vitreous degeneration, right eye: Secondary | ICD-10-CM

## 2022-06-19 DIAGNOSIS — H4311 Vitreous hemorrhage, right eye: Secondary | ICD-10-CM | POA: Diagnosis not present

## 2022-06-19 DIAGNOSIS — H2513 Age-related nuclear cataract, bilateral: Secondary | ICD-10-CM | POA: Diagnosis not present

## 2022-06-19 NOTE — Progress Notes (Signed)
06/19/2022     CHIEF COMPLAINT Patient presents for  Chief Complaint  Patient presents with   Retina Follow Up      HISTORY OF PRESENT ILLNESS: Natalie David is a 61 y.o. female who presents to the clinic today for:   HPI     Retina Follow Up           Diagnosis: Other   Laterality: right eye   Severity: moderate   Course: stable         Comments   1 YR FU FP. Pt stated vision has remained stable since last visit. Pt denies new floaters and FOL.       Last edited by Silvestre Moment on 06/19/2022  3:55 PM.      Referring physician: Hali Marry, [address],  [place] [address]  HISTORICAL INFORMATION:   Selected notes from the MEDICAL RECORD NUMBER    Lab Results  Component Value Date   HGBA1C 5.2 03/16/2019     CURRENT MEDICATIONS: No current outpatient medications on file. (Ophthalmic Drugs)   No current facility-administered medications for this visit. (Ophthalmic Drugs)   Current Outpatient Medications (Other)  Medication Sig   bimatoprost (LATISSE) 0.03 % ophthalmic solution PLACE 1 DROP ON APPLICATOR/Q-TIP AND APPLY TO EYEBROWS NIGHTLY   Calcium Carb-Cholecalciferol 600-800 MG-UNIT TABS Take 1 tablet by mouth 2 (two) times daily.   finasteride (PROSCAR) 5 MG tablet Take 1 tablet (5 mg total) by mouth daily.   metoprolol succinate (TOPROL-XL) 25 MG 24 hr tablet Take 1 tablet (25 mg total) by mouth daily.   minoxidil (LONITEN) 2.5 MG tablet Take by mouth.   minoxidil (LONITEN) 2.5 MG tablet Take 1/2 tablet (1.25 mg total) by mouth daily.   Multiple Vitamins-Minerals (WOMENS MULTIVITAMIN PLUS PO) Take 1 capsule by mouth daily.   Omega-3 Fatty Acids (FISH OIL) 1000 MG CAPS Take by mouth 2 (two) times daily.   No current facility-administered medications for this visit. (Other)      REVIEW OF SYSTEMS: ROS   Negative for: Constitutional, Gastrointestinal, Neurological, Skin, Genitourinary,  Musculoskeletal, HENT, Endocrine, Cardiovascular, Eyes, Respiratory, Psychiatric, Allergic/Imm, Heme/Lymph Last edited by Silvestre Moment on 06/19/2022  3:55 PM.       ALLERGIES Allergies  Allergen Reactions   Codeine Nausea Only    PAST MEDICAL HISTORY Past Medical History:  Diagnosis Date   Osteoporosis    Tachycardia    Past Surgical History:  Procedure Laterality Date   AUGMENTATION MAMMAPLASTY     BLEPHAROPLASTY  2011   BREAST ENHANCEMENT SURGERY  2010   COLONOSCOPY     LAPAROSCOPY     for endometriosis   LAPAROTOMY     Rt ectopic   LYMPH NODE BIOPSY     Rt underarm   NASAL SEPTUM SURGERY      FAMILY HISTORY Family History  Problem Relation Age of Onset   Heart disease Father    Hypertension Father    Thyroid disease Father    Aortic aneurysm Father    Cirrhosis Father        Medication side effect.    Hypertension Sister    Thyroid disease Other    Hyperlipidemia Daughter    Hyperlipidemia Sister    Kidney disease Mother    Aortic aneurysm Mother    COPD Mother        worked in Research scientist (life sciences) cancer Neg Hx    Esophageal cancer Neg  Hx    Rectal cancer Neg Hx    Stomach cancer Neg Hx    Breast cancer Neg Hx     SOCIAL HISTORY Social History   Tobacco Use   Smoking status: Never   Smokeless tobacco: Never  Vaping Use   Vaping Use: Never used  Substance Use Topics   Alcohol use: Yes    Alcohol/week: 3.0 standard drinks of alcohol    Types: 3 Glasses of wine per week   Drug use: No         OPHTHALMIC EXAM:  Base Eye Exam     Visual Acuity (ETDRS)       Right Left   Dist cc 20/20 20/80 -1   Dist ph cc  20/30 +2    Correction: Contacts         Tonometry (Tonopen, 4:03 PM)       Right Left   Pressure 22 21         Pupils       Pupils APD   Right PERRL None   Left PERRL None         Visual Fields       Left Right    Full Full         Extraocular Movement       Right Left    Full, Ortho Full, Ortho          Neuro/Psych     Oriented x3: Yes         Dilation     Both eyes: 1.0% Mydriacyl, 2.5% Phenylephrine @ 4:03 PM           Slit Lamp and Fundus Exam     External Exam       Right Left   External Normal Normal         Slit Lamp Exam       Right Left   Lids/Lashes Normal Normal   Conjunctiva/Sclera White and quiet White and quiet   Cornea Clear Clear   Anterior Chamber Deep and quiet Deep and quiet   Iris Round and reactive Round and reactive   Lens 1+ Nuclear sclerosis 1+ Nuclear sclerosis   Anterior Vitreous Normal, vitreous veils anteriorly, no cells Normal         Fundus Exam       Right Left   Posterior Vitreous Posterior vitreous detachment, Posterior vitreous detachment   Disc Normal Normal   C/D Ratio 0.25 0.25   Macula Normal Normal   Vessels Normal Normal   Periphery Retinal tear, operculated, with obvious partial transection of retinal vein superotemporal quadrant 10:00 meridian at the equator, with good retinopexy and no new retinal breaks. No new breaks            IMAGING AND PROCEDURES  Imaging and Procedures for 06/19/22  OCT, Retina - OU - Both Eyes       Right Eye Quality was good. Scan locations included subfoveal. Central Foveal Thickness: 326. Progression has no prior data. Findings include normal foveal contour, epiretinal membrane.   Left Eye Quality was good. Scan locations included subfoveal. Central Foveal Thickness: 279. Progression has no prior data. Findings include normal foveal contour.   Notes Incidental posterior vitreous detachment with perifoveal operculum OD, no macular hole  OD with minor epiretinal membrane and early thickening compared to last visit      Color Fundus Photography Optos - OU - Both Eyes       Right Eye Disc findings  include normal observations. Macula : normal observations.   Left Eye Progression has no prior data. Disc findings include normal observations. Macula : normal  observations. Vessels : normal observations. Periphery : normal observations.   Notes Good retinopexy operculated retinal tear superotemporally OD stable overall  Posterior vitreous detachment also noted right eye.  No new hemorrhage and no residual hemorrhage seen, incidental posterior vitreous detachment visible OS, incidental posterior vitreous detachment, retinal periphery is entirely normal.              ASSESSMENT/PLAN:  Operculated retinal tear of right eye Good retinopexy superotemporal quadrant right eye.  No new breaks.  Vitreous hemorrhage of right eye (HCC) Condition cleared  Posterior vitreous detachment of right eye  The nature of posterior vitreous detachment was discussed with the patient as well as its physiology, its age prevalence, and its possible implication regarding retinal breaks and detachment.  An informational brochure was offered to the patient.  All the patient's questions were answered.  The patient was asked to return if new or different flashes or floaters develops.   Patient was instructed to contact office immediately if any new changes were noticed. I explained to the patient that vitreous inside the eye is similar to jello inside a bowl. As the jello melts it can start to pull away from the bowl, similarly the vitreous throughout our lives can begin to pull away from the retina. That process is called a posterior vitreous detachment. In some cases, the vitreous can tug hard enough on the retina to form a retinal tear. I discussed with the patient the signs and symptoms of a retinal detachment.  Do not rub the eye.    Nuclear sclerotic cataract of both eyes Follow-up with Dr. Bonney Roussel in [place]   1. Operculated retinal tear of right eye  H33.311 OCT, Retina - OU - Both Eyes    Color Fundus Photography Optos - OU - Both Eyes    2. Vitreous hemorrhage of right eye ([place])  H43.11     3. Posterior vitreous detachment of  right eye  H43.811     4. Nuclear sclerotic cataract of both eyes  H25.13       1. OPercolated retinal tear OD well treated.  2.  OD with minor epiretinal membrane yet increasing thickness will observe  3.  Moderate cataract OU follow-up with Dr. Domingo Cocking in [place] Ordered this visit:  No orders of the defined types were placed in this encounter.      Return in about 1 year (around 06/20/2023) for DILATE OU, OCT.  There are no Patient Instructions on file for this visit.   Explained the diagnoses, plan, and follow up with the patient and they expressed understanding.  Patient expressed understanding of the importance of proper follow up care.   Clent Demark Zaley Talley M.D. Diseases & Surgery of the Retina and Vitreous Retina & Diabetic [place] 06/19/22     Abbreviations: M myopia (nearsighted); A astigmatism; H hyperopia (farsighted); P presbyopia; Mrx spectacle prescription;  CTL contact lenses; OD right eye; OS left eye; OU both eyes  XT exotropia; ET esotropia; PEK punctate epithelial keratitis; PEE punctate epithelial erosions; DES dry eye syndrome; MGD meibomian gland dysfunction; ATs artificial tears; PFAT's preservative free artificial tears; [place] nuclear sclerotic cataract; PSC posterior subcapsular cataract; ERM epi-retinal membrane; PVD posterior vitreous detachment; RD retinal detachment; DM diabetes mellitus; DR diabetic retinopathy; NPDR non-proliferative diabetic retinopathy; PDR proliferative diabetic  retinopathy; CSME clinically significant macular edema; DME diabetic macular edema; dbh dot blot hemorrhages; CWS cotton wool spot; POAG primary open angle glaucoma; C/D cup-to-disc ratio; HVF humphrey visual field; GVF goldmann visual field; OCT optical coherence tomography; IOP intraocular pressure; BRVO Branch retinal vein occlusion; CRVO central retinal vein occlusion; CRAO central retinal artery occlusion; BRAO branch retinal artery occlusion; RT  retinal tear; SB scleral buckle; PPV pars plana vitrectomy; VH Vitreous hemorrhage; PRP panretinal laser photocoagulation; IVK intravitreal kenalog; VMT vitreomacular traction; MH Macular hole;  NVD neovascularization of the disc; NVE neovascularization elsewhere; AREDS age related eye disease study; ARMD age related macular degeneration; POAG primary open angle glaucoma; EBMD epithelial/anterior basement membrane dystrophy; ACIOL anterior chamber intraocular lens; IOL intraocular lens; PCIOL posterior chamber intraocular lens; Phaco/IOL phacoemulsification with intraocular lens placement; [place]and photorefractive keratectomy; LASIK laser assisted in situ keratomileusis; HTN hypertension; DM diabetes mellitus; COPD chronic obstructive pulmonary disease

## 2022-06-19 NOTE — Assessment & Plan Note (Signed)
Good retinopexy superotemporal quadrant right eye.  No new breaks.

## 2022-06-19 NOTE — Assessment & Plan Note (Signed)

## 2022-06-19 NOTE — Assessment & Plan Note (Signed)
Follow-up with Dr. Bonney Roussel in [place]

## 2022-06-19 NOTE — Assessment & Plan Note (Signed)
Condition cleared

## 2022-07-10 ENCOUNTER — Other Ambulatory Visit (HOSPITAL_BASED_OUTPATIENT_CLINIC_OR_DEPARTMENT_OTHER): Payer: Self-pay

## 2022-07-10 MED ORDER — FLUARIX QUADRIVALENT 0.5 ML IM SUSY
0.5000 mL | PREFILLED_SYRINGE | Freq: Once | INTRAMUSCULAR | 0 refills | Status: AC
  Filled 2022-07-10: qty 0.5, 1d supply, fill #0

## 2022-08-04 ENCOUNTER — Other Ambulatory Visit (HOSPITAL_COMMUNITY): Payer: Self-pay

## 2022-08-11 ENCOUNTER — Other Ambulatory Visit (HOSPITAL_BASED_OUTPATIENT_CLINIC_OR_DEPARTMENT_OTHER): Payer: Self-pay

## 2022-08-11 MED ORDER — COMIRNATY 30 MCG/0.3ML IM SUSY
PREFILLED_SYRINGE | INTRAMUSCULAR | 0 refills | Status: DC
  Filled 2022-08-11: qty 0.3, 1d supply, fill #0

## 2022-08-21 ENCOUNTER — Other Ambulatory Visit (HOSPITAL_COMMUNITY): Payer: Self-pay

## 2022-10-23 ENCOUNTER — Other Ambulatory Visit (HOSPITAL_COMMUNITY): Payer: Self-pay

## 2022-10-23 MED ORDER — FINASTERIDE 5 MG PO TABS
5.0000 mg | ORAL_TABLET | Freq: Every day | ORAL | 3 refills | Status: DC
  Filled 2022-10-23: qty 90, 90d supply, fill #0
  Filled 2023-01-27: qty 90, 90d supply, fill #1

## 2022-10-23 MED ORDER — MINOXIDIL 2.5 MG PO TABS
2.5000 mg | ORAL_TABLET | Freq: Every day | ORAL | 2 refills | Status: DC
  Filled 2022-10-23: qty 45, 45d supply, fill #0

## 2022-10-24 ENCOUNTER — Other Ambulatory Visit (HOSPITAL_COMMUNITY): Payer: Self-pay

## 2022-10-24 ENCOUNTER — Other Ambulatory Visit: Payer: Self-pay

## 2022-10-24 MED ORDER — MINOXIDIL 2.5 MG PO TABS
2.5000 mg | ORAL_TABLET | Freq: Every day | ORAL | 1 refills | Status: DC
  Filled 2022-10-24 (×2): qty 90, 90d supply, fill #0
  Filled 2023-01-27: qty 90, 90d supply, fill #1

## 2023-01-06 ENCOUNTER — Ambulatory Visit (INDEPENDENT_AMBULATORY_CARE_PROVIDER_SITE_OTHER): Payer: 59

## 2023-01-06 ENCOUNTER — Ambulatory Visit (INDEPENDENT_AMBULATORY_CARE_PROVIDER_SITE_OTHER): Payer: 59 | Admitting: Sports Medicine

## 2023-01-06 DIAGNOSIS — M1711 Unilateral primary osteoarthritis, right knee: Secondary | ICD-10-CM

## 2023-01-06 DIAGNOSIS — M25562 Pain in left knee: Secondary | ICD-10-CM | POA: Diagnosis not present

## 2023-01-06 DIAGNOSIS — M7542 Impingement syndrome of left shoulder: Secondary | ICD-10-CM

## 2023-01-06 DIAGNOSIS — M25561 Pain in right knee: Secondary | ICD-10-CM

## 2023-01-06 DIAGNOSIS — M19012 Primary osteoarthritis, left shoulder: Secondary | ICD-10-CM | POA: Diagnosis not present

## 2023-01-06 DIAGNOSIS — M25512 Pain in left shoulder: Secondary | ICD-10-CM

## 2023-01-06 NOTE — Assessment & Plan Note (Signed)
This is a very pleasant 62 year old female, she is a former Marine scientist, she has had months of pain right knee medial joint line occasional buckling without other mechanical symptoms, her exam is for the most part benign. Suspect osteoarthritis and degenerative meniscal tearing, she has CKD so we are avoiding NSAIDs, she can continue Tylenol as needed, adding x-rays, formal PT. Return to see me in 6 weeks, injection if not better.

## 2023-01-06 NOTE — Progress Notes (Signed)
    Procedures performed today:    None.  Independent interpretation of notes and tests performed by another provider:   None.  Brief History, Exam, Impression, and Recommendations:    Primary osteoarthritis of right knee This is a very pleasant 62 year old female, she is a former Marine scientist, she has had months of pain right knee medial joint line occasional buckling without other mechanical symptoms, her exam is for the most part benign. Suspect osteoarthritis and degenerative meniscal tearing, she has CKD so we are avoiding NSAIDs, she can continue Tylenol as needed, adding x-rays, formal PT. Return to see me in 6 weeks, injection if not better.  Impingement syndrome, shoulder, left Also having several weeks of pain left shoulder over the deltoid worse with abduction after an abnormal movement. On exam she has positive impingement signs, she has positive glenohumeral signs as well, we discussed the anatomy and pathophysiology, she will continue medications, adding formal PT and x-rays, return in 6 weeks, subacromial injection if not better.    ____________________________________________ Gwen Her. Dianah Field, M.D., ABFM., CAQSM., AME. Primary Care and Sports Medicine  [facility]  Adjunct Professor of [place] [employer] [facility] [employer]  Risk manager

## 2023-01-06 NOTE — Assessment & Plan Note (Signed)
Also having several weeks of pain left shoulder over the deltoid worse with abduction after an abnormal movement. On exam she has positive impingement signs, she has positive glenohumeral signs as well, we discussed the anatomy and pathophysiology, she will continue medications, adding formal PT and x-rays, return in 6 weeks, subacromial injection if not better.

## 2023-01-13 ENCOUNTER — Ambulatory Visit: Payer: 59 | Attending: Sports Medicine | Admitting: Physical Therapy

## 2023-01-13 ENCOUNTER — Encounter: Payer: Self-pay | Admitting: Physical Therapy

## 2023-01-13 ENCOUNTER — Other Ambulatory Visit: Payer: Self-pay

## 2023-01-13 DIAGNOSIS — M6281 Muscle weakness (generalized): Secondary | ICD-10-CM

## 2023-01-13 DIAGNOSIS — M25612 Stiffness of left shoulder, not elsewhere classified: Secondary | ICD-10-CM | POA: Diagnosis present

## 2023-01-13 DIAGNOSIS — M25561 Pain in right knee: Secondary | ICD-10-CM | POA: Insufficient documentation

## 2023-01-13 DIAGNOSIS — M25512 Pain in left shoulder: Secondary | ICD-10-CM | POA: Insufficient documentation

## 2023-01-13 DIAGNOSIS — R2689 Other abnormalities of gait and mobility: Secondary | ICD-10-CM

## 2023-01-13 DIAGNOSIS — G8929 Other chronic pain: Secondary | ICD-10-CM

## 2023-01-13 DIAGNOSIS — M1711 Unilateral primary osteoarthritis, right knee: Secondary | ICD-10-CM | POA: Insufficient documentation

## 2023-01-13 NOTE — Therapy (Signed)
OUTPATIENT PHYSICAL THERAPY SHOULDER AND KNEE EVALUATION   Patient Name: Natalie David MRN: [id number] DOB:10-05-1961, 62 y.o., female Today's Date: 01/13/2023  END OF SESSION:  PT End of Session - 01/13/23 0926     Visit Number 1    Authorization Type [facility] [employer]    PT Start Time 0930    PT Stop Time 1015    PT Time Calculation (min) 45 min    Activity Tolerance Patient tolerated treatment well    Behavior During Therapy [facility] for tasks assessed/performed             Past Medical History:  Diagnosis Date   Osteoporosis    Tachycardia    Past Surgical History:  Procedure Laterality Date   AUGMENTATION MAMMAPLASTY     BLEPHAROPLASTY  2011   BREAST ENHANCEMENT SURGERY  2010   COLONOSCOPY     LAPAROSCOPY     for endometriosis   LAPAROTOMY     Rt ectopic   LYMPH NODE BIOPSY     Rt underarm   NASAL SEPTUM SURGERY     Patient Active Problem List   Diagnosis Date Noted   Primary osteoarthritis of right knee 01/06/2023   Impingement syndrome, shoulder, left 01/06/2023   Posterior vitreous detachment of right eye 06/19/2022   Nuclear sclerotic cataract of both eyes 06/19/2022   Vitreous hemorrhage of right eye 04/23/2021   Operculated retinal tear of right eye 04/23/2021   Post-menopausal 04/10/2021   Vaginal dryness, menopausal 04/10/2021   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  Rationale for Evaluation and Treatment: Rehabilitation  ONSET DATE: Knee pain x 1 year; shoulder pain since Aug 2023  SUBJECTIVE:                                                                                                                                                                                       SUBJECTIVE STATEMENT: Pt reports at rest her knee doesn't bother her; however, squatting hurts. Pt states she retired and was holding her grandchild and bent over and bothered her knee. Shoulder doesn't hurt her either unless with certain motions. She states she was reaching under and her shoulder hurt. Reaching behind or reaching up it will have shooting pain. Pt was given exercises but not sure which ones to  do at home. Pt is a retired Psychologist, forensic dominance: Right  PERTINENT HISTORY: No known injury  PAIN:  Are you having pain? Yes: NPRS scale: 0 currently, 4 at worst/10 Pain location: top/side of L shoulder Pain description: Sharp shooting pain down arm Aggravating factors: Reaching back and reaching shoulder, certain sleeping positions Relieving factors: rest  Are you having pain? Yes: NPRS scale: 0 currently, 5 or 6 at worst/10 Pain location: Inside of R knee Pain description: sharp/shooting; soreness; Aggravating factors: squatting (coming up), hopping/jumping Relieving factors: rest   PRECAUTIONS: None  WEIGHT BEARING RESTRICTIONS: No  FALLS:  Has patient fallen in last 6 months? 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 No loss of joint space. No other bony abnormalities. Soft tissues are normal.  R knee x-ray 3/26: FINDINGS: Minimal degenerative changes in the patellofemoral compartment with a tiny  superior osteophyte. No fracture, dislocation, bony lesion, joint effusion, or other degenerative change.  PATIENT SURVEYS:  Quick DASH: 18.2% LEFS: 76.3%  COGNITION: Overall cognitive status: Within functional limits for tasks assessed     SENSATION: WFL  POSTURE: Rounded shoulders/increased thoracic kyphosis, abducted scapulae, R scapula anteriorly tilted with GH head anterior  UPPER EXTREMITY ROM:   Active ROM Right eval Left eval  Shoulder flexion 145 135*  Shoulder extension 80 80  Shoulder abduction 170 145*  Shoulder adduction    Shoulder internal rotation T4 T5*  Shoulder external rotation T3 T2  Elbow flexion    Elbow extension    Wrist flexion    Wrist extension    Wrist ulnar deviation    Wrist radial deviation    Wrist pronation    Wrist supination    (Blank rows = not tested) * = concordant pain  LOWER EXTREMITY ROM:   WFL  UPPER EXTREMITY MMT:  MMT Right eval Left eval  Shoulder flexion 5 4  Shoulder extension 5 5  Shoulder abduction 5 4-  Shoulder adduction    Shoulder internal rotation 5 5  Shoulder external rotation 5 4  Middle trapezius 3 4-  Lower trapezius 3- 4-  Elbow flexion 5 5  Elbow extension 5 5  Wrist flexion    Wrist extension    Wrist ulnar deviation    Wrist radial deviation    Wrist pronation    Wrist supination    Grip strength (lbs)    (Blank rows = not tested)  LOWER EXTREMITY MMT:    MMT Right eval Left eval  Hip flexion 4 5  Hip extension 3+ 5  Hip abduction 3+ 5  Hip adduction    Hip internal rotation    Hip external rotation    Knee flexion 4- 5  Knee extension 5 5  Ankle dorsiflexion    Ankle plantarflexion    Ankle inversion    Ankle eversion     (Blank rows = not tested)   SHOULDER SPECIAL TESTS: Impingement tests: Neer impingement test: positive  and Painful arc test: positive  SLAP lesions: Biceps load test: negative Instability tests: did not assess Rotator cuff assessment: Empty can  test: positive  and Internal rotation lag sign: positive  Biceps assessment: Speed's test: negative  FUNCTIONAL TEST: Squat: very narrow BOS, increased anterior knee translation/anterior weight shift, pain with pushing to stand  PALPATION:  No overt tenderness to palpation   TODAY'S TREATMENT:                                                                                                                                         DATE: 01/13/23 See HEP below   PATIENT EDUCATION: Education details: Exam findings, POC, initial HEP Person educated: Patient Education method: Explanation, Demonstration, Tactile cues, Verbal cues, and Handouts Education comprehension: verbalized understanding, returned demonstration, and needs further education  HOME EXERCISE PROGRAM: Access Code: [email or web address] URL: [email or web address] Date: 01/13/2023 Prepared by: Estill Bamberg April Thurnell Garbe  Exercises - Sit to Stand with Resistance Around Legs  - 1 x daily - 7 x weekly - 2 sets - 10 reps - Side Stepping with Resistance at Ankles  - 1 x daily - 7 x weekly - 2 sets - 10 reps - Backward Monster Walks  - 1 x daily - 7 x weekly - 2 sets - 10 reps - Shoulder External Rotation and Scapular Retraction with Resistance  - 1 x daily - 7 x weekly - 2 sets - 10 reps - 3 sec hold - Shoulder W - External Rotation with Resistance  - 1 x daily - 7 x weekly - 2 sets - 10 reps - Standing Bilateral Low Shoulder Row with Anchored Resistance  - 1 x daily - 7 x weekly - 2 sets - 10 reps  ASSESSMENT:  CLINICAL IMPRESSION: Patient is a 62 y.o. F who was seen today for physical therapy evaluation and treatment for R knee pain and L shoulder pain.   OBJECTIVE IMPAIRMENTS: decreased activity tolerance, decreased balance, decreased mobility, decreased ROM, decreased strength, impaired UE functional use, improper body mechanics, postural dysfunction, and pain.   ACTIVITY LIMITATIONS: carrying, lifting, bending, transfers,  hygiene/grooming, locomotion level, and caring for others  PARTICIPATION LIMITATIONS: cleaning, interpersonal relationship, community activity, occupation, and yard work  PERSONAL FACTORS: Age, Fitness, Past/current experiences, and Time since onset of injury/illness/exacerbation are also affecting patient's functional outcome.   REHAB POTENTIAL: Good  CLINICAL DECISION MAKING: Evolving/moderate complexity  EVALUATION COMPLEXITY: Moderate   GOALS: Goals reviewed with patient? Yes  SHORT TERM GOALS: Target date: 02/10/2023   Pt will be ind with initial HEP Baseline: Goal status: INITIAL  2.  Pt will demo L = R shoulder AROM with no pain Baseline:  Goal status: INITIAL  3.  Pt will be able to demo proper squat form without pain Baseline:  Goal status: INITIAL   LONG TERM GOALS: Target date: 03/10/2023   Pt will be ind with management and progression of HEP Baseline:  Goal status: INITIAL  2.  Pt will be able to perform deep squat on/off floor without  pain Baseline:  Goal status: INITIAL  3.  Pt will be able to lift and carry at least 10# without pain in L shoulder Baseline:  Goal status: INITIAL  4.  Pt will demo improved quick DASH score to </=10% to demo less disability Baseline: 18.2% Goal status: INITIAL  5.  Pt will demo improved LEFS score to >/=86% to demo less disability Baseline: 76% Goal status: INITIAL   PLAN:  PT FREQUENCY: 1x/week  PT DURATION: 8 weeks  PLANNED INTERVENTIONS: Therapeutic exercises, Therapeutic activity, Neuromuscular re-education, Balance training, Gait training, Patient/Family education, Self Care, Joint mobilization, Stair training, Aquatic Therapy, Dry Needling, Electrical stimulation, Cryotherapy, Moist heat, Taping, Vasopneumatic device, Ionotophoresis 4mg /ml Dexamethasone, Manual therapy, and Re-evaluation  PLAN FOR NEXT SESSION: Assess response to HEP. Work on Teacher, music. Initiate quad strengthening.  Work on hip strengthening and squat mechanics. Lunging as tolerated (wants to be able to perform low squats for gardening/playing with grandchild)   [facility] G[facility]y Levan, PT 01/13/2023, 9:27 AM

## 2023-01-20 ENCOUNTER — Ambulatory Visit: Payer: 59 | Admitting: Physical Therapy

## 2023-01-20 ENCOUNTER — Encounter: Payer: Self-pay | Admitting: Physical Therapy

## 2023-01-20 DIAGNOSIS — M25512 Pain in left shoulder: Secondary | ICD-10-CM | POA: Diagnosis not present

## 2023-01-20 DIAGNOSIS — R2689 Other abnormalities of gait and mobility: Secondary | ICD-10-CM | POA: Diagnosis not present

## 2023-01-20 DIAGNOSIS — M25561 Pain in right knee: Secondary | ICD-10-CM

## 2023-01-20 DIAGNOSIS — M6281 Muscle weakness (generalized): Secondary | ICD-10-CM

## 2023-01-20 DIAGNOSIS — M25612 Stiffness of left shoulder, not elsewhere classified: Secondary | ICD-10-CM | POA: Diagnosis not present

## 2023-01-20 DIAGNOSIS — G8929 Other chronic pain: Secondary | ICD-10-CM | POA: Diagnosis not present

## 2023-01-20 DIAGNOSIS — M1711 Unilateral primary osteoarthritis, right knee: Secondary | ICD-10-CM | POA: Diagnosis not present

## 2023-01-20 NOTE — Therapy (Signed)
OUTPATIENT PHYSICAL THERAPY TREATMENT   Patient Name: Natalie David MRN: [id number] DOB:Feb 18, 1961, 62 y.o., female Today's Date: 01/20/2023  END OF SESSION:  PT End of Session - 01/20/23 1013     Visit Number 2    Number of Visits 8    Date for PT Re-Evaluation 03/10/23    Authorization Type [facility] [employer]    PT Start Time 1015    PT Stop Time 1100    PT Time Calculation (min) 45 min    Activity Tolerance Patient tolerated treatment well    Behavior During Therapy WFL for tasks assessed/performed              Past Medical History:  Diagnosis Date   Osteoporosis    Tachycardia    Past Surgical History:  Procedure Laterality Date   AUGMENTATION MAMMAPLASTY     BLEPHAROPLASTY  2011   BREAST ENHANCEMENT SURGERY  2010   COLONOSCOPY     LAPAROSCOPY     for endometriosis   LAPAROTOMY     Rt ectopic   LYMPH NODE BIOPSY     Rt underarm   NASAL SEPTUM SURGERY     Patient Active Problem List   Diagnosis Date Noted   Primary osteoarthritis of right knee 01/06/2023   Impingement syndrome, shoulder, left 01/06/2023   Posterior vitreous detachment of right eye 06/19/2022   Nuclear sclerotic cataract of both eyes 06/19/2022   Vitreous hemorrhage of right eye 04/23/2021   Operculated retinal tear of right eye 04/23/2021   Post-menopausal 04/10/2021   Vaginal dryness, menopausal 04/10/2021   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   Osteopenia 12/25/2015   CKD stage G3a/A1, GFR 45-59 and albumin creatinine ratio <30 mg/g 12/12/2012   Tachycardia 12/11/2011   Elevated MCV 12/11/2011    PCP: Agapito Games, MD  REFERRING PROVIDER: Monica Becton, MD  REFERRING DIAG: M17.11 (ICD-10-CM) - Primary osteoarthritis of right knee Left shoulder Impingement Shoulder  THERAPY DIAG:  Muscle weakness (generalized)  Other abnormalities of gait and mobility  Right knee pain, unspecified chronicity  Chronic left shoulder pain  Stiffness of left shoulder, not  elsewhere classified  Rationale for Evaluation and Treatment: Rehabilitation  ONSET DATE: Knee pain x 1 year; shoulder pain since Aug 2023  SUBJECTIVE:                                                                                                                                                                                      SUBJECTIVE STATEMENT: Has been doing exercises. States she thinks they have been helping.  Hand dominance: Right  PERTINENT HISTORY: No known injury From eval: Pt reports at rest her  knee doesn't bother her; however, squatting hurts. Pt states she retired and was holding her grandchild and bent over and bothered her knee. Shoulder doesn't hurt her either unless with certain motions. She states she was reaching under and her shoulder hurt. Reaching behind or reaching up it will have shooting pain. Pt was given exercises but not sure which ones to do at home. Pt is a retired Engineer, civil (consulting)  PAIN:  Are you having pain? Yes: NPRS scale: 0 currently, 4 at worst/10 Pain location: top/side of L shoulder Pain description: Sharp shooting pain down arm Aggravating factors: Reaching back and reaching shoulder, certain sleeping positions Relieving factors: rest  Are you having pain? Yes: NPRS scale: 0 currently, 5 or 6 at worst/10 Pain location: Inside of R knee Pain description: sharp/shooting; soreness; Aggravating factors: squatting (coming up), hopping/jumping Relieving factors: rest   PRECAUTIONS: None  WEIGHT BEARING RESTRICTIONS: No  FALLS:  Has patient fallen in last 6 months? 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 No loss of joint space. No other bony abnormalities. Soft tissues are normal.  R knee x-ray 3/26: FINDINGS: Minimal degenerative changes in the patellofemoral compartment with a tiny superior osteophyte. No fracture, dislocation, bony lesion, joint effusion, or other degenerative change.  PATIENT SURVEYS:  Quick DASH: 18.2% LEFS: 76.3%  POSTURE: Rounded shoulders/increased thoracic kyphosis, abducted scapulae, R scapula anteriorly tilted with GH head anterior  UPPER EXTREMITY ROM:   Active ROM Right eval Left eval  Shoulder flexion 145 135*  Shoulder extension 80 80  Shoulder abduction 170 145*  Shoulder adduction    Shoulder internal rotation T4 T5*  Shoulder external rotation T3 T2  Elbow flexion    Elbow extension    Wrist flexion    Wrist extension    Wrist ulnar deviation    Wrist radial deviation    Wrist pronation    Wrist supination    (Blank rows = not tested) * = concordant pain  LOWER EXTREMITY ROM:   WFL  UPPER EXTREMITY MMT:  MMT Right eval Left eval  Shoulder flexion 5 4  Shoulder extension 5 5  Shoulder abduction 5 4-  Shoulder adduction    Shoulder internal rotation 5 5  Shoulder external rotation 5 4  Middle trapezius 3 4-  Lower trapezius 3- 4-  Elbow flexion 5 5  Elbow extension 5 5  Wrist flexion    Wrist extension    Wrist ulnar deviation    Wrist radial deviation    Wrist pronation    Wrist supination    Grip strength (lbs)    (Blank rows = not tested)  LOWER EXTREMITY MMT:    MMT Right eval Left eval  Hip flexion 4 5  Hip extension 3+ 5  Hip abduction 3+ 5  Hip adduction    Hip internal rotation    Hip external rotation    Knee flexion 4- 5  Knee extension 5 5  Ankle dorsiflexion    Ankle plantarflexion    Ankle inversion    Ankle eversion     (Blank rows = not  tested)   SHOULDER  SPECIAL TESTS: Impingement tests: Neer impingement test: positive  and Painful arc test: positive  SLAP lesions: Biceps load test: negative Instability tests: did not assess Rotator cuff assessment: Empty can test: positive  and Internal rotation lag sign: positive  Biceps assessment: Speed's test: negative  FUNCTIONAL TEST: Squat: very narrow BOS, increased anterior knee translation/anterior weight shift, pain with pushing to stand  PALPATION:  No overt tenderness to palpation   TODAY'S TREATMENT:        [facility] Adult PT Treatment:                                                DATE: 01/20/23 Therapeutic Exercise: L6 x 5 min UEs/LEs Standing Doorway pec stretch low, mid, high x30 sec each Side step red TB 2x10 Forwards/Backwards monster walk red TB 2x10 Eccentric step down 6" step 2x10 Shoulder ER red TB 2x10 W red TB 2x10 Shoulder ext red TB 2x10 Shoulder horizontal abd red TB 2x10 Sitting Hamstring stretch x 30 sec Piriformis stretch x 30 sec Sit<>stand red TB 2x10                                                                                                                                    DATE: 01/13/23 See HEP below   PATIENT EDUCATION: Education details: Exam findings, POC, initial HEP Person educated: Patient Education method: Explanation, Demonstration, Tactile cues, Verbal cues, and Handouts Education comprehension: verbalized understanding, returned demonstration, and needs further education  HOME EXERCISE PROGRAM: Access Code: [email or web address] URL: [email or web address] Date: 01/13/2023 Prepared by: Vernon Prey April Kirstie Peri  Exercises - Sit to Stand with Resistance Around Legs  - 1 x daily - 7 x weekly - 2 sets - 10 reps - Side Stepping with Resistance at Ankles  - 1 x daily - 7 x weekly - 2 sets - 10 reps - Backward Monster Walks  - 1 x daily - 7 x weekly - 2 sets - 10 reps - Shoulder External Rotation and Scapular Retraction with Resistance  - 1 x  daily - 7 x weekly - 2 sets - 10 reps - 3 sec hold - Shoulder W - External Rotation with Resistance  - 1 x daily - 7 x weekly - 2 sets - 10 reps - Standing Bilateral Low Shoulder Row with Anchored Resistance  - 1 x daily - 7 x weekly - 2 sets - 10 reps  ASSESSMENT:  CLINICAL IMPRESSION: Reviewed HEP. Pt tolerated well. Working on posterior chain strengthening (posterior shoulder to LEs). Some cueing to decrease truncal lean/compensation with standing hip exercises.   OBJECTIVE IMPAIRMENTS: decreased activity tolerance, decreased balance, decreased mobility, decreased ROM, decreased strength, impaired UE functional use, improper body mechanics, postural dysfunction, and pain.    GOALS: Goals reviewed  with patient? Yes  SHORT TERM GOALS: Target date: 02/10/2023   Pt will be ind with initial HEP Baseline: Goal status: INITIAL  2.  Pt will demo L = R shoulder AROM with no pain Baseline:  Goal status: INITIAL  3.  Pt will be able to demo proper squat form without pain Baseline:  Goal status: INITIAL   LONG TERM GOALS: Target date: 03/10/2023   Pt will be ind with management and progression of HEP Baseline:  Goal status: INITIAL  2.  Pt will be able to perform deep squat on/off floor without pain Baseline:  Goal status: INITIAL  3.  Pt will be able to lift and carry at least 10# without pain in L shoulder Baseline:  Goal status: INITIAL  4.  Pt will demo improved quick DASH score to </=10% to demo less disability Baseline: 18.2% Goal status: INITIAL  5.  Pt will demo improved LEFS score to >/=86% to demo less disability Baseline: 76% Goal status: INITIAL   PLAN:  PT FREQUENCY: 1x/week  PT DURATION: 8 weeks  PLANNED INTERVENTIONS: Therapeutic exercises, Therapeutic activity, Neuromuscular re-education, Balance training, Gait training, Patient/Family education, Self Care, Joint mobilization, Stair training, Aquatic Therapy, Dry Needling, Electrical stimulation,  Cryotherapy, Moist heat, Taping, Vasopneumatic device, Ionotophoresis 4mg /ml Dexamethasone, Manual therapy, and Re-evaluation  PLAN FOR NEXT SESSION: Assess response to HEP. Work on Contractorscapular/postural strengthening. Initiate quad strengthening. Work on hip strengthening and squat mechanics. Lunging as tolerated (wants to be able to perform low squats for gardening/playing with grandchild)   Norton HospitalGellen April Ma L Jasneet Schobert, PT 01/20/2023, 10:13 AM

## 2023-01-27 ENCOUNTER — Other Ambulatory Visit (HOSPITAL_COMMUNITY): Payer: Self-pay

## 2023-01-27 ENCOUNTER — Ambulatory Visit: Payer: 59 | Admitting: Family Medicine

## 2023-01-27 ENCOUNTER — Encounter: Payer: Self-pay | Admitting: Physical Therapy

## 2023-01-27 ENCOUNTER — Ambulatory Visit: Payer: 59 | Admitting: Physical Therapy

## 2023-01-27 DIAGNOSIS — R2689 Other abnormalities of gait and mobility: Secondary | ICD-10-CM

## 2023-01-27 DIAGNOSIS — M25561 Pain in right knee: Secondary | ICD-10-CM | POA: Diagnosis not present

## 2023-01-27 DIAGNOSIS — G8929 Other chronic pain: Secondary | ICD-10-CM

## 2023-01-27 DIAGNOSIS — M6281 Muscle weakness (generalized): Secondary | ICD-10-CM

## 2023-01-27 DIAGNOSIS — M25612 Stiffness of left shoulder, not elsewhere classified: Secondary | ICD-10-CM

## 2023-01-27 DIAGNOSIS — M1711 Unilateral primary osteoarthritis, right knee: Secondary | ICD-10-CM | POA: Diagnosis not present

## 2023-01-27 DIAGNOSIS — M25512 Pain in left shoulder: Secondary | ICD-10-CM | POA: Diagnosis not present

## 2023-01-27 NOTE — Therapy (Signed)
OUTPATIENT PHYSICAL THERAPY TREATMENT   Patient Name: Natalie David MRN: [id number] DOB:May 04, 1961, 62 y.o., female Today's Date: 01/27/2023  END OF SESSION:  PT End of Session - 01/27/23 1019     Visit Number 3    Number of Visits 8    Date for PT Re-Evaluation 03/10/23    Authorization Type [facility] [employer]    PT Start Time 1019    PT Stop Time 1100    PT Time Calculation (min) 41 min    Activity Tolerance Patient tolerated treatment well    Behavior During Therapy WFL for tasks assessed/performed              Past Medical History:  Diagnosis Date   Osteoporosis    Tachycardia    Past Surgical History:  Procedure Laterality Date   AUGMENTATION MAMMAPLASTY     BLEPHAROPLASTY  2011   BREAST ENHANCEMENT SURGERY  2010   COLONOSCOPY     LAPAROSCOPY     for endometriosis   LAPAROTOMY     Rt ectopic   LYMPH NODE BIOPSY     Rt underarm   NASAL SEPTUM SURGERY     Patient Active Problem List   Diagnosis Date Noted   Primary osteoarthritis of right knee 01/06/2023   Impingement syndrome, shoulder, left 01/06/2023   Posterior vitreous detachment of right eye 06/19/2022   Nuclear sclerotic cataract of both eyes 06/19/2022   Vitreous hemorrhage of right eye 04/23/2021   Operculated retinal tear of right eye 04/23/2021   Post-menopausal 04/10/2021   Vaginal dryness, menopausal 04/10/2021   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   Osteopenia 12/25/2015   CKD stage G3a/A1, GFR 45-59 and albumin creatinine ratio <30 mg/g 12/12/2012   Tachycardia 12/11/2011   Elevated MCV 12/11/2011    PCP: Agapito Games, MD  REFERRING PROVIDER: Monica Becton, MD  REFERRING DIAG: M17.11 (ICD-10-CM) - Primary osteoarthritis of right knee Left shoulder Impingement Shoulder  THERAPY DIAG:  Muscle weakness (generalized)  Other abnormalities of gait and mobility  Right knee pain, unspecified chronicity  Chronic left shoulder pain  Stiffness of left shoulder,  not elsewhere classified  Rationale for Evaluation and Treatment: Rehabilitation  ONSET DATE: Knee pain x 1 year; shoulder pain since Aug 2023  SUBJECTIVE:                                                                                                                                                                                      SUBJECTIVE STATEMENT: Pt reports she can tell a difference with her shoulder. "W" exercise is the most challenging. Has not been able to tell a big difference in the knee  so far.  Hand dominance: Right  PERTINENT HISTORY: No known injury From eval: Pt reports at rest her knee doesn't bother her; however, squatting hurts. Pt states she retired and was holding her grandchild and bent over and bothered her knee. Shoulder doesn't hurt her either unless with certain motions. She states she was reaching under and her shoulder hurt. Reaching behind or reaching up it will have shooting pain. Pt was given exercises but not sure which ones to do at home. Pt is a retired Engineer, civil (consulting)  PAIN:  Are you having pain? Yes: NPRS scale: 0 currently, 4 at worst/10 Pain location: top/side of L shoulder Pain description: Sharp shooting pain down arm Aggravating factors: Reaching back and reaching shoulder, certain sleeping positions Relieving factors: rest  Are you having pain? Yes: NPRS scale: 0 currently, 5 or 6 at worst/10 Pain location: Inside of R knee Pain description: sharp/shooting; soreness; Aggravating factors: squatting (coming up), hopping/jumping Relieving factors: rest   PRECAUTIONS: None  WEIGHT BEARING RESTRICTIONS: No  FALLS:  Has patient fallen in last 6 months? 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 No loss of joint space. No other bony abnormalities. Soft tissues are normal.  R knee x-ray 3/26: FINDINGS: Minimal degenerative changes in the patellofemoral compartment with a tiny superior osteophyte. No fracture, dislocation, bony lesion, joint effusion, or other degenerative change.  PATIENT SURVEYS:  Quick DASH: 18.2% LEFS: 76.3%  POSTURE: Rounded shoulders/increased thoracic kyphosis, abducted scapulae, R scapula anteriorly tilted with GH head anterior  UPPER EXTREMITY ROM:   Active ROM Right eval Left eval  Shoulder flexion 145 135*  Shoulder extension 80 80  Shoulder abduction 170 145*  Shoulder adduction    Shoulder internal rotation T4 T5*  Shoulder external rotation T3 T2  Elbow flexion    Elbow extension    Wrist flexion    Wrist extension    Wrist ulnar deviation    Wrist radial deviation    Wrist pronation    Wrist supination    (Blank rows = not tested) * = concordant pain  LOWER EXTREMITY ROM:   WFL  UPPER EXTREMITY MMT:  MMT Right eval Left eval  Shoulder flexion 5 4  Shoulder extension 5 5  Shoulder abduction 5 4-  Shoulder adduction    Shoulder internal rotation 5 5  Shoulder external rotation 5 4  Middle trapezius 3 4-  Lower trapezius 3- 4-  Elbow flexion 5 5  Elbow extension 5 5  Wrist flexion    Wrist extension    Wrist ulnar deviation    Wrist radial deviation    Wrist pronation    Wrist supination    Grip strength (lbs)    (Blank rows = not tested)  LOWER EXTREMITY MMT:    MMT Right eval Left eval  Hip flexion 4 5  Hip extension 3+ 5  Hip abduction 3+ 5  Hip adduction    Hip internal rotation    Hip external rotation    Knee flexion 4- 5  Knee extension 5 5  Ankle dorsiflexion    Ankle  plantarflexion     Ankle inversion    Ankle eversion     (Blank rows = not tested)   SHOULDER SPECIAL TESTS: Impingement tests: Neer impingement test: positive  and Painful arc test: positive  SLAP lesions: Biceps load test: negative Instability tests: did not assess Rotator cuff assessment: Empty can test: positive  and Internal rotation lag sign: positive  Biceps assessment: Speed's test: negative  FUNCTIONAL TEST: Squat: very narrow BOS, increased anterior knee translation/anterior weight shift, pain with pushing to stand  PALPATION:  No overt tenderness to palpation   TODAY'S TREATMENT:        [facility] Adult PT Treatment:                                                DATE: 01/27/23 Therapeutic Exercise: Recumbent bike L2 x 5 min Sit<>stand staggered 2x10 R leg back Lunge 2x10 SLR + ER 2x10 S/L hip abd 2x10 Bridge 2x10 Captain morgan 10x5 sec W red TB 2x10   [facility] Adult PT Treatment:                                                DATE: 01/20/23 Therapeutic Exercise: L6 x 5 min UEs/LEs Standing Doorway pec stretch low, mid, high x30 sec each Side step red TB 2x10 Forwards/Backwards monster walk red TB 2x10 Eccentric step down 6" step 2x10 Shoulder ER red TB 2x10 W red TB 2x10 Shoulder ext red TB 2x10 Shoulder horizontal abd red TB 2x10 Sitting Hamstring stretch x 30 sec Piriformis stretch x 30 sec Sit<>stand red TB 2x10                                                                                                                                    DATE: 01/13/23 See HEP below   PATIENT EDUCATION: Education details: Exam findings, POC, initial HEP Person educated: Patient Education method: Explanation, Demonstration, Tactile cues, Verbal cues, and Handouts Education comprehension: verbalized understanding, returned demonstration, and needs further education  HOME EXERCISE PROGRAM: Access Code: [email or web address] URL: [email or web address] Date: 01/13/2023 Prepared by: Vernon Prey  April Kirstie Peri  Exercises - Sit to Stand with Resistance Around Legs  - 1 x daily - 7 x weekly - 2 sets - 10 reps - Side Stepping with Resistance at Ankles  - 1 x daily - 7 x weekly - 2 sets - 10 reps - Backward Monster Walks  - 1 x daily - 7 x weekly - 2 sets - 10 reps - Shoulder External Rotation and Scapular Retraction with Resistance  - 1 x daily - 7 x weekly - 2 sets - 10 reps - 3 sec hold -  Shoulder W - External Rotation with Resistance  - 1 x daily - 7 x weekly - 2 sets - 10 reps - Standing Bilateral Low Shoulder Row with Anchored Resistance  - 1 x daily - 7 x weekly - 2 sets - 10 reps  ASSESSMENT:  CLINICAL IMPRESSION: Concentrated primarily on knee/hip strengthening this session. Pt's shoulder has been progressing well overall but remains weak in posterior shoulder girdle with higher shoulder elevations. Plan to work on deeper squats and shoulder elevation next session if pt has tolerated HEP well.  OBJECTIVE IMPAIRMENTS: decreased activity tolerance, decreased balance, decreased mobility, decreased ROM, decreased strength, impaired UE functional use, improper body mechanics, postural dysfunction, and pain.    GOALS: Goals reviewed with patient? Yes  SHORT TERM GOALS: Target date: 02/10/2023   Pt will be ind with initial HEP Baseline: Goal status: INITIAL  2.  Pt will demo L = R shoulder AROM with no pain Baseline:  Goal status: INITIAL  3.  Pt will be able to demo proper squat form without pain Baseline:  Goal status: INITIAL   LONG TERM GOALS: Target date: 03/10/2023   Pt will be ind with management and progression of HEP Baseline:  Goal status: INITIAL  2.  Pt will be able to perform deep squat on/off floor without pain Baseline:  Goal status: INITIAL  3.  Pt will be able to lift and carry at least 10# without pain in L shoulder Baseline:  Goal status: INITIAL  4.  Pt will demo improved quick DASH score to </=10% to demo less disability Baseline:  18.2% Goal status: INITIAL  5.  Pt will demo improved LEFS score to >/=86% to demo less disability Baseline: 76% Goal status: INITIAL   PLAN:  PT FREQUENCY: 1x/week  PT DURATION: 8 weeks  PLANNED INTERVENTIONS: Therapeutic exercises, Therapeutic activity, Neuromuscular re-education, Balance training, Gait training, Patient/Family education, Self Care, Joint mobilization, Stair training, Aquatic Therapy, Dry Needling, Electrical stimulation, Cryotherapy, Moist heat, Taping, Vasopneumatic device, Ionotophoresis /ml Dexamethasone, Manual therapy, and Re-evaluation  PLAN FOR NEXT SESSION: Assess response to HEP. Work on Contractor. Initiate quad strengthening. Work on hip strengthening and squat mechanics. Lunging as tolerated (wants to be able to perform low squats for gardening/playing with grandchild)   [facility] April Ma L Mayelin Panos, PT 01/27/2023, 10:19 AM

## 2023-02-03 ENCOUNTER — Encounter: Payer: Self-pay | Admitting: Physical Therapy

## 2023-02-03 ENCOUNTER — Ambulatory Visit: Payer: 59 | Admitting: Physical Therapy

## 2023-02-03 DIAGNOSIS — G8929 Other chronic pain: Secondary | ICD-10-CM | POA: Diagnosis not present

## 2023-02-03 DIAGNOSIS — R2689 Other abnormalities of gait and mobility: Secondary | ICD-10-CM | POA: Diagnosis not present

## 2023-02-03 DIAGNOSIS — M25512 Pain in left shoulder: Secondary | ICD-10-CM | POA: Diagnosis not present

## 2023-02-03 DIAGNOSIS — M6281 Muscle weakness (generalized): Secondary | ICD-10-CM

## 2023-02-03 DIAGNOSIS — M1711 Unilateral primary osteoarthritis, right knee: Secondary | ICD-10-CM | POA: Diagnosis not present

## 2023-02-03 DIAGNOSIS — M25561 Pain in right knee: Secondary | ICD-10-CM | POA: Diagnosis not present

## 2023-02-03 DIAGNOSIS — M25612 Stiffness of left shoulder, not elsewhere classified: Secondary | ICD-10-CM | POA: Diagnosis not present

## 2023-02-03 NOTE — Therapy (Signed)
OUTPATIENT PHYSICAL THERAPY TREATMENT   Patient Name: Natalie David MRN: [id number] DOB:July 16, 1961, 62 y.o., female Today's Date: 02/03/2023  END OF SESSION:  PT End of Session - 02/03/23 1015     Visit Number 4    Number of Visits 8    Date for PT Re-Evaluation 03/10/23    Authorization Type [facility] [employer]    PT Start Time 1015    PT Stop Time 1100    PT Time Calculation (min) 45 min    Activity Tolerance Patient tolerated treatment well    Behavior During Therapy WFL for tasks assessed/performed             Past Medical History:  Diagnosis Date   Osteoporosis    Tachycardia    Past Surgical History:  Procedure Laterality Date   AUGMENTATION MAMMAPLASTY     BLEPHAROPLASTY  2011   BREAST ENHANCEMENT SURGERY  2010   COLONOSCOPY     LAPAROSCOPY     for endometriosis   LAPAROTOMY     Rt ectopic   LYMPH NODE BIOPSY     Rt underarm   NASAL SEPTUM SURGERY     Patient Active Problem List   Diagnosis Date Noted   Primary osteoarthritis of right knee 01/06/2023   Impingement syndrome, shoulder, left 01/06/2023   Posterior vitreous detachment of right eye 06/19/2022   Nuclear sclerotic cataract of both eyes 06/19/2022   Vitreous hemorrhage of right eye 04/23/2021   Operculated retinal tear of right eye 04/23/2021   Post-menopausal 04/10/2021   Vaginal dryness, menopausal 04/10/2021   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   Osteopenia 12/25/2015   CKD stage G3a/A1, GFR 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 and albumin creatinine ratio <30 mg/g 12/12/2012   Tachycardia 12/11/2011   Elevated MCV 12/11/2011    PCP: Agapito Games, MD  REFERRING PROVIDER: Monica Becton, MD  REFERRING DIAG: M17.11 (ICD-10-CM) - Primary osteoarthritis of right knee Left shoulder Impingement Shoulder  THERAPY DIAG:  Muscle weakness (generalized)  Other abnormalities of gait and mobility  Right knee pain, unspecified chronicity  Chronic left shoulder pain  Stiffness of left shoulder, not  elsewhere classified  Rationale for Evaluation and Treatment: Rehabilitation  ONSET DATE: Knee pain x 1 year; shoulder pain since Aug 2023  SUBJECTIVE:                                                                                                                                                                                      SUBJECTIVE STATEMENT: Pt states her quads have been very sore and painful. Has not felt her normal knee pain. States lunges are still hard and gets a sharp pain.  Hand  dominance: Right  PERTINENT HISTORY: No known injury From eval: Pt reports at rest her knee doesn't bother her; however, squatting hurts. Pt states she retired and was holding her grandchild and bent over and bothered her knee. Shoulder doesn't hurt her either unless with certain motions. She states she was reaching under and her shoulder hurt. Reaching behind or reaching up it will have shooting pain. Pt was given exercises but not sure which ones to do at home. Pt is a retired Engineer, civil (consulting)  PAIN:  Are you having pain? Yes: NPRS scale: 0 currently, 4 at worst/10 Pain location: top/side of L shoulder Pain description: Sharp shooting pain down arm Aggravating factors: Reaching back and reaching shoulder, certain sleeping positions Relieving factors: rest  Are you having pain? Yes: NPRS scale: 0 currently, 5 or 6 at worst/10 Pain location: Inside of R knee Pain description: sharp/shooting; soreness; Aggravating factors: squatting (coming up), hopping/jumping Relieving factors: rest   PRECAUTIONS: None  WEIGHT BEARING RESTRICTIONS: No  FALLS:  Has patient fallen in last 6 months? 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 No loss of joint space. No other bony abnormalities. Soft tissues are normal.  R knee x-ray 3/26: FINDINGS: Minimal degenerative changes in the patellofemoral compartment with a tiny superior osteophyte. No fracture, dislocation, bony lesion, joint effusion, or other degenerative change.  PATIENT SURVEYS:  Quick DASH: 18.2% LEFS: 76.3%  POSTURE: Rounded shoulders/increased thoracic kyphosis, abducted scapulae, R scapula anteriorly tilted with GH head anterior  UPPER EXTREMITY ROM:   Active ROM Right eval Left eval  Shoulder flexion 145 135*  Shoulder extension 80 80  Shoulder abduction 170 145*  Shoulder adduction    Shoulder internal rotation T4 T5*  Shoulder external rotation T3 T2  Elbow flexion    Elbow extension    Wrist flexion    Wrist extension    Wrist ulnar deviation    Wrist radial deviation    Wrist pronation    Wrist supination    (Blank rows = not tested) * = concordant pain  LOWER EXTREMITY ROM:   WFL  UPPER EXTREMITY MMT:  MMT Right eval Left eval  Shoulder flexion 5 4  Shoulder extension 5 5  Shoulder abduction 5 4-  Shoulder adduction    Shoulder internal rotation 5 5  Shoulder external rotation 5 4  Middle trapezius 3 4-  Lower trapezius 3- 4-  Elbow flexion 5 5  Elbow extension 5 5  Wrist flexion    Wrist extension    Wrist ulnar deviation    Wrist radial deviation    Wrist pronation    Wrist supination    Grip strength (lbs)    (Blank rows = not tested)  LOWER EXTREMITY MMT:    MMT Right eval Left eval  Hip flexion 4 5  Hip extension 3+ 5  Hip abduction 3+ 5  Hip adduction    Hip internal rotation    Hip external rotation    Knee flexion 4- 5  Knee extension 5 5  Ankle dorsiflexion    Ankle plantarflexion  Ankle inversion     Ankle eversion     (Blank rows = not tested)   SHOULDER SPECIAL TESTS: Impingement tests: Neer impingement test: positive  and Painful arc test: positive  SLAP lesions: Biceps load test: negative Instability tests: did not assess Rotator cuff assessment: Empty can test: positive  and Internal rotation lag sign: positive  Biceps assessment: Speed's test: negative  FUNCTIONAL TEST: Squat: very narrow BOS, increased anterior knee translation/anterior weight shift, pain with pushing to stand  PALPATION:  No overt tenderness to palpation   TODAY'S TREATMENT:       [facility] Adult PT Treatment:                                                DATE: 02/03/23 Therapeutic Exercise: Recumbent bike L2 x 5 min Prone quad stretch with strap 2x30 sec R&L Prone hip extension 2x10 Prone "I", "W", "T" 2x10 each Attempted prone shoulder ER but too painful on L Standing "Y" setting 2x10 Standing shoulder ER abducted at 60 deg reactive iso yellow TB 2x10 Standing shoulder IR abducted at 60 deg reactive iso yellow TB 2x10    [facility] Adult PT Treatment:                                                DATE: 01/27/23 Therapeutic Exercise: Recumbent bike L2 x 5 min Sit<>stand staggered 2x10 R leg back Lunge 2x10 SLR + ER 2x10 S/L hip abd 2x10 Bridge 2x10 Captain morgan 10x5 sec W red TB 2x10   [facility] Adult PT Treatment:                                                DATE: 01/20/23 Therapeutic Exercise: L6 x 5 min UEs/LEs Standing Doorway pec stretch low, mid, high x30 sec each Side step red TB 2x10 Forwards/Backwards monster walk red TB 2x10 Eccentric step down 6" step 2x10 Shoulder ER red TB 2x10 W red TB 2x10 Shoulder ext red TB 2x10 Shoulder horizontal abd red TB 2x10 Sitting Hamstring stretch x 30 sec Piriformis stretch x 30 sec Sit<>stand red TB 2x10     PATIENT EDUCATION: Education details: Exam findings, POC, initial HEP Person educated: Patient Education method: Explanation,  Demonstration, Tactile cues, Verbal cues, and Handouts Education comprehension: verbalized understanding, returned demonstration, and needs further education  HOME EXERCISE PROGRAM: Access Code: [email or web address] URL: [email or web address] Date: 01/13/2023 Prepared by: Vernon Prey April Kirstie Peri  Exercises - Sit to Stand with Resistance Around Legs  - 1 x daily - 7 x weekly - 2 sets - 10 reps - Side Stepping with Resistance at Ankles  - 1 x daily - 7 x weekly - 2 sets - 10 reps - Backward Monster Walks  - 1 x daily - 7 x weekly - 2 sets - 10 reps - Shoulder External Rotation and Scapular Retraction with Resistance  - 1 x daily - 7 x weekly - 2 sets - 10 reps - 3 sec hold - Shoulder W - External Rotation with Resistance  - 1 x daily - 7 x weekly - 2 sets -  10 reps - Standing Bilateral Low Shoulder Row with Anchored Resistance  - 1 x daily - 7 x weekly - 2 sets - 10 reps  ASSESSMENT:  CLINICAL IMPRESSION: Pt is very sore in quads. Discussed alternating her upper body and lower body exercises to reduce soreness. Pt with too much difficulty performing lunges -- modified to prone. Will work on deeper squats next session. Primarily worked on shoulder strengthening with arms more elevated. Very weak shoulder ER with arm abducted higher than 45 deg (Difficulty performing without multiple compensations). Pt is demonstrating increased range of shoulder motion with less pain.  OBJECTIVE IMPAIRMENTS: decreased activity tolerance, decreased balance, decreased mobility, decreased ROM, decreased strength, impaired UE functional use, improper body mechanics, postural dysfunction, and pain.    GOALS: Goals reviewed with patient? Yes  SHORT TERM GOALS: Target date: 02/10/2023   Pt will be ind with initial HEP Baseline: Goal status: INITIAL  2.  Pt will demo L = R shoulder AROM with no pain Baseline:  Goal status: INITIAL  3.  Pt will be able to demo proper squat form without pain Baseline:   Goal status: INITIAL   LONG TERM GOALS: Target date: 03/10/2023   Pt will be ind with management and progression of HEP Baseline:  Goal status: INITIAL  2.  Pt will be able to perform deep squat on/off floor without pain Baseline:  Goal status: INITIAL  3.  Pt will be able to lift and carry at least 10# without pain in L shoulder Baseline:  Goal status: INITIAL  4.  Pt will demo improved quick DASH score to </=10% to demo less disability Baseline: 18.2% Goal status: INITIAL  5.  Pt will demo improved LEFS score to >/=86% to demo less disability Baseline: 76% Goal status: INITIAL   PLAN:  PT FREQUENCY: 1x/week  PT DURATION: 8 weeks  PLANNED INTERVENTIONS: Therapeutic exercises, Therapeutic activity, Neuromuscular re-education, Balance training, Gait training, Patient/Family education, Self Care, Joint mobilization, Stair training, Aquatic Therapy, Dry Needling, Electrical stimulation, Cryotherapy, Moist heat, Taping, Vasopneumatic device, Ionotophoresis /ml Dexamethasone, Manual therapy, and Re-evaluation  PLAN FOR NEXT SESSION: Assess response to HEP. Work on Contractor. Initiate quad strengthening. Work on hip strengthening and squat mechanics. Lunging as tolerated (wants to be able to perform low squats for gardening/playing with grandchild)   [facility] [place], PT 02/03/2023, 10:16 AM

## 2023-02-10 ENCOUNTER — Ambulatory Visit: Payer: 59 | Admitting: Physical Therapy

## 2023-02-10 ENCOUNTER — Encounter: Payer: Self-pay | Admitting: Physical Therapy

## 2023-02-10 DIAGNOSIS — M25612 Stiffness of left shoulder, not elsewhere classified: Secondary | ICD-10-CM | POA: Diagnosis not present

## 2023-02-10 DIAGNOSIS — M25561 Pain in right knee: Secondary | ICD-10-CM | POA: Diagnosis not present

## 2023-02-10 DIAGNOSIS — G8929 Other chronic pain: Secondary | ICD-10-CM

## 2023-02-10 DIAGNOSIS — R2689 Other abnormalities of gait and mobility: Secondary | ICD-10-CM

## 2023-02-10 DIAGNOSIS — M1711 Unilateral primary osteoarthritis, right knee: Secondary | ICD-10-CM | POA: Diagnosis not present

## 2023-02-10 DIAGNOSIS — M25512 Pain in left shoulder: Secondary | ICD-10-CM | POA: Diagnosis not present

## 2023-02-10 DIAGNOSIS — M6281 Muscle weakness (generalized): Secondary | ICD-10-CM | POA: Diagnosis not present

## 2023-02-10 NOTE — Therapy (Signed)
OUTPATIENT PHYSICAL THERAPY TREATMENT   Patient Name: Natalie David MRN: [id number] DOB:Oct 12, 1961, 62 y.o., female Today's Date: 02/10/2023  END OF SESSION:  PT End of Session - 02/10/23 0936     Visit Number 5    Number of Visits 8    Date for PT Re-Evaluation 03/10/23    Authorization Type [facility] [employer]    PT Start Time 0935    PT Stop Time 1015    PT Time Calculation (min) 40 min    Activity Tolerance Patient tolerated treatment well    Behavior During Therapy [facility] for tasks assessed/performed             Past Medical History:  Diagnosis Date   Osteoporosis    Tachycardia    Past Surgical History:  Procedure Laterality Date   AUGMENTATION MAMMAPLASTY     BLEPHAROPLASTY  2011   BREAST ENHANCEMENT SURGERY  2010   COLONOSCOPY     LAPAROSCOPY     for endometriosis   LAPAROTOMY     Rt ectopic   LYMPH NODE BIOPSY     Rt underarm   NASAL SEPTUM SURGERY     Patient Active Problem List   Diagnosis Date Noted   Primary osteoarthritis of right knee 01/06/2023   Impingement syndrome, shoulder, left 01/06/2023   Posterior vitreous detachment of right eye 06/19/2022   Nuclear sclerotic cataract of both eyes 06/19/2022   Vitreous hemorrhage of right eye (HCC) 04/23/2021   Operculated retinal tear of right eye 04/23/2021   Post-menopausal 04/10/2021   Vaginal dryness, menopausal 04/10/2021   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   Osteopenia 12/25/2015   CKD stage G3a/A1, GFR 45-59 and albumin creatinine ratio <30 mg/g (HCC) 12/12/2012   Tachycardia 12/11/2011   Elevated MCV 12/11/2011    PCP: Agapito Games, MD  REFERRING PROVIDER: Monica Becton, MD  REFERRING DIAG: M17.11 (ICD-10-CM) - Primary osteoarthritis of right knee Left shoulder Impingement Shoulder  THERAPY DIAG:  Muscle weakness (generalized)  Other abnormalities of gait and mobility  Right knee pain, unspecified chronicity  Chronic left shoulder pain  Stiffness of left  shoulder, not elsewhere classified  Rationale for Evaluation and Treatment: Rehabilitation  ONSET DATE: Knee pain x 1 year; shoulder pain since Aug 2023  SUBJECTIVE:                                                                                                                                                                                      SUBJECTIVE STATEMENT: Pt states she does feel she is progressing. Will be going to [place]. Has remained compliant to exercises.  Hand dominance: Right  PERTINENT HISTORY: No known  injury From eval: Pt reports at rest her knee doesn't bother her; however, squatting hurts. Pt states she retired and was holding her grandchild and bent over and bothered her knee. Shoulder doesn't hurt her either unless with certain motions. She states she was reaching under and her shoulder hurt. Reaching behind or reaching up it will have shooting pain. Pt was given exercises but not sure which ones to do at home. Pt is a retired Engineer, civil (consulting)  PAIN:  Are you having pain? Yes: NPRS scale: 0 currently, 4 at worst/10 Pain location: top/side of L shoulder Pain description: Sharp shooting pain down arm Aggravating factors: Reaching back and reaching shoulder, certain sleeping positions Relieving factors: rest  Are you having pain? Yes: NPRS scale: 0 currently, 5 or 6 at worst/10 Pain location: Inside of R knee Pain description: sharp/shooting; soreness; Aggravating factors: squatting (coming up), hopping/jumping Relieving factors: rest   PRECAUTIONS: None  WEIGHT BEARING RESTRICTIONS: No  FALLS:  Has patient fallen in last 6 months? 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 No loss of joint space. No other bony abnormalities. Soft tissues are normal.  R knee x-ray 3/26: FINDINGS: Minimal degenerative changes in the patellofemoral compartment with a tiny superior osteophyte. No fracture, dislocation, bony lesion, joint effusion, or other degenerative change.  PATIENT SURVEYS:  Quick DASH: 18.2% LEFS: 76.3%  POSTURE: Rounded shoulders/increased thoracic kyphosis, abducted scapulae, R scapula anteriorly tilted with GH head anterior  UPPER EXTREMITY ROM:   Active ROM Right eval Left eval Left 02/10/23  Shoulder flexion 145 135* WFL no pain  Shoulder extension 80 80 WFL no pain  Shoulder abduction 170 145* WFL no pain  Shoulder adduction     Shoulder internal rotation T4 T5* T4 slight pain  Shoulder external rotation T3 T2 T3 no pain  Elbow flexion     Elbow extension     Wrist flexion     Wrist extension     Wrist ulnar deviation     Wrist radial deviation     Wrist pronation     Wrist supination     (Blank rows = not tested) * = concordant pain  LOWER EXTREMITY ROM:   WFL  UPPER EXTREMITY MMT:  MMT Right eval Left eval  Shoulder flexion 5 4  Shoulder extension 5 5  Shoulder abduction 5 4-  Shoulder adduction    Shoulder internal rotation 5 5  Shoulder external rotation 5 4  Middle trapezius 3 4-  Lower trapezius 3- 4-  Elbow flexion 5 5  Elbow extension 5 5  Wrist flexion    Wrist extension    Wrist ulnar deviation    Wrist radial deviation    Wrist pronation    Wrist supination    Grip strength (lbs)    (Blank rows = not tested)  LOWER EXTREMITY MMT:    MMT Right eval Left eval  Hip flexion 4 5  Hip extension 3+ 5  Hip abduction 3+ 5  Hip adduction    Hip internal rotation  Hip external rotation    Knee flexion 4- 5  Knee extension 5 5  Ankle dorsiflexion    Ankle plantarflexion    Ankle inversion    Ankle eversion     (Blank rows = not tested)   SHOULDER SPECIAL TESTS: Impingement tests: Neer impingement test: positive  and Painful arc test: positive  SLAP lesions: Biceps load test: negative Instability tests: did not assess Rotator cuff assessment: Empty can test: positive  and Internal rotation lag sign: positive  Biceps assessment: Speed's test: negative  FUNCTIONAL TEST: Squat: very narrow BOS, increased anterior knee translation/anterior weight shift, pain with pushing to stand  PALPATION:  No overt tenderness to palpation   TODAY'S TREATMENT:      [facility] Adult PT Treatment:                                                DATE: 02/10/23 Therapeutic Exercise: Recumbent bike L3 x 5 min Quad stretch 2x30 sec R&L Prone hip ext 3x10 S/L hip abd 3x10 Deep squats at counter 3x10 S/L sleeper stretch 2x30 sec IR AROM with dowel x10 Shoulder IR reactive iso yellow TB at 60 deg abd x10, at 90 deg abd x10 Therapeutic Activity: Rechecking goals    [facility] Adult PT Treatment:                                                DATE: 02/03/23 Therapeutic Exercise: Recumbent bike L2 x 5 min Prone quad stretch with strap 2x30 sec R&L Prone hip extension 2x10 Prone "I", "W", "T" 2x10 each Attempted prone shoulder ER but too painful on L Standing "Y" setting 2x10 Standing shoulder ER abducted at 60 deg reactive iso yellow TB 2x10 Standing shoulder IR abducted at 60 deg reactive iso yellow TB 2x10    [facility] Adult PT Treatment:                                                DATE: 01/27/23 Therapeutic Exercise: Recumbent bike L2 x 5 min Sit<>stand staggered 2x10 R leg back Lunge 2x10 SLR + ER 2x10 S/L hip abd 2x10 Bridge 2x10 Captain morgan 10x5 sec W red TB 2x10    PATIENT EDUCATION: Education details: Exam findings, POC, initial HEP Person educated:  Patient Education method: Explanation, Demonstration, Tactile cues, Verbal cues, and Handouts Education comprehension: verbalized understanding, returned demonstration, and needs further education  HOME EXERCISE PROGRAM: Access Code: [email or web address] URL: [email or web address] Date: 01/13/2023 Prepared by: Vernon Prey April Kirstie Peri  Exercises - Sit to Stand with Resistance Around Legs  - 1 x daily - 7 x weekly - 2 sets - 10 reps - Side Stepping with Resistance at Ankles  - 1 x daily - 7 x weekly - 2 sets - 10 reps - Backward Monster Walks  - 1 x daily - 7 x weekly - 2 sets - 10 reps - Shoulder External Rotation and Scapular Retraction with Resistance  - 1 x daily - 7 x weekly - 2 sets - 10 reps - 3 sec hold - Shoulder W - External Rotation with Resistance  -  1 x daily - 7 x weekly - 2 sets - 10 reps - Standing Bilateral Low Shoulder Row with Anchored Resistance  - 1 x daily - 7 x weekly - 2 sets - 10 reps  ASSESSMENT:  CLINICAL IMPRESSION: Ms. Natalie David has been meeting her LTGs. She was able to demonstrate deep squatting this session without knee pain. L shoulder is only mildly painful primarily with shoulder IR but otherwise she is able to use it functionally without any issues. She still has some shoulder ER weakness at > 60 deg shoulder abd. Pt is aware of what exercises to perform to address this. At this point, pt feels ready to d/c from PT. Provided and tweaked last bit of her HEP to address her continued deficits. Discussed keeping her chart open until the end of her POC in case of any exacerbations or issues with new exercises.   OBJECTIVE IMPAIRMENTS: decreased activity tolerance, decreased balance, decreased mobility, decreased ROM, decreased strength, impaired UE functional use, improper body mechanics, postural dysfunction, and pain.    GOALS: Goals reviewed with patient? Yes  SHORT TERM GOALS: Target date: 02/10/2023   Pt will be ind with initial HEP Baseline: Goal  status: MET  2.  Pt will demo L = R shoulder AROM with no pain Baseline:  Goal status: MET  3.  Pt will be able to demo proper squat form without pain Baseline:  Goal status: MET   LONG TERM GOALS: Target date: 03/10/2023   Pt will be ind with management and progression of HEP Baseline:  Goal status: MET  2.  Pt will be able to perform deep squat on/off floor without pain Baseline:  Goal status: MET  3.  Pt will be able to lift and carry at least 10# without pain in L shoulder Baseline:  02/10/23: Able to perform Goal status: MET  4.  Pt will demo improved quick DASH score to </=10% to demo less disability Baseline: 18.2% 02/10/23: 4.5% Goal status: MET  5.  Pt will demo improved LEFS score to >/=86% to demo less disability Baseline: 76% 02/10/23: 92.5% Goal status: MET   PLAN:  PT FREQUENCY: 1x/week  PT DURATION: 8 weeks  PLANNED INTERVENTIONS: Therapeutic exercises, Therapeutic activity, Neuromuscular re-education, Balance training, Gait training, Patient/Family education, Self Care, Joint mobilization, Stair training, Aquatic Therapy, Dry Needling, Electrical stimulation, Cryotherapy, Moist heat, Taping, Vasopneumatic device, Ionotophoresis 4mg /ml Dexamethasone, Manual therapy, and Re-evaluation  PLAN FOR NEXT SESSION: Assess response to HEP. Work on Contractor. Initiate quad strengthening. Work on hip strengthening and squat mechanics. Wants to be able to perform low squats for gardening/playing with grandchild.   Dekisha Mesmer April Ma L Jefry Lesinski, PT 02/10/2023, 9:36 AM

## 2023-02-11 ENCOUNTER — Encounter: Payer: Self-pay | Admitting: Internal Medicine

## 2023-02-24 ENCOUNTER — Ambulatory Visit (INDEPENDENT_AMBULATORY_CARE_PROVIDER_SITE_OTHER): Payer: 59 | Admitting: Sports Medicine

## 2023-02-24 ENCOUNTER — Encounter: Payer: Self-pay | Admitting: Internal Medicine

## 2023-02-24 DIAGNOSIS — M7542 Impingement syndrome of left shoulder: Secondary | ICD-10-CM

## 2023-02-24 DIAGNOSIS — M1711 Unilateral primary osteoarthritis, right knee: Secondary | ICD-10-CM

## 2023-02-24 NOTE — Assessment & Plan Note (Signed)
The exquisitely pleasant 62 year old female, has had several weeks of left shoulder pain, she had impingement and glenohumeral signs on exam, x-rays did show glenohumeral degenerative changes, due to CKD we avoided NSAIDs, added some arthritis strength Tylenol, formal PT, she returns today doing significantly better, continue home conditioning indefinitely and return to see me as needed for this.

## 2023-02-24 NOTE — Progress Notes (Signed)
    Procedures performed today:    None.  Independent interpretation of notes and tests performed by another provider:   None.  Brief History, Exam, Impression, and Recommendations:    Impingement syndrome, shoulder, left The exquisitely pleasant 62 year old female, has had several weeks of left shoulder pain, she had impingement and glenohumeral signs on exam, x-rays did show glenohumeral degenerative changes, due to CKD we avoided NSAIDs, added some arthritis strength Tylenol, formal PT, she returns today doing significantly better, continue home conditioning indefinitely and return to see me as needed for this.  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    ____________________________________________ Ihor Austin. Benjamin Stain, M.D., ABFM., CAQSM., AME. Primary Care and Sports Medicine [facility]  Adjunct Professor of Family Medicine  [employer]  Restaurant manager, fast food

## 2023-02-24 NOTE — Assessment & Plan Note (Signed)
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

## 2023-03-24 ENCOUNTER — Other Ambulatory Visit (HOSPITAL_COMMUNITY): Payer: Self-pay

## 2023-03-24 ENCOUNTER — Encounter: Payer: Self-pay | Admitting: Internal Medicine

## 2023-03-24 ENCOUNTER — Ambulatory Visit (AMBULATORY_SURGERY_CENTER): Payer: 59

## 2023-03-24 VITALS — Ht 68.0 in | Wt 128.0 lb

## 2023-03-24 DIAGNOSIS — Z1211 Encounter for screening for malignant neoplasm of colon: Secondary | ICD-10-CM

## 2023-03-24 MED ORDER — NA SULFATE-K SULFATE-MG SULF 17.5-3.13-1.6 GM/177ML PO SOLN
1.0000 | Freq: Once | ORAL | 0 refills | Status: AC
Start: 2023-03-24 — End: 2023-03-26
  Filled 2023-03-24: qty 354, 2d supply, fill #0

## 2023-03-24 NOTE — Progress Notes (Signed)
No egg or soy allergy known to patient  No issues known to pt with past sedation with any surgeries or procedures Patient denies ever being told they had issues or difficulty with intubation  No FH of Malignant Hyperthermia Pt is not on diet pills Pt is not on  home 02  Pt is not on blood thinners  Pt denies issues with constipation  No A fib or A flutter: pt reports tachycardia  Have any cardiac testing pending--no  Level of assistance: independent   PV complete. Prep instructions reviewed with patient. Instructions sent out via mychart and home address.   Patient's chart reviewed by Cathlyn Parsons CNRA prior to previsit and patient appropriate for the [facility].  Previsit completed and red dot placed by patient's name on their procedure day (on provider's schedule).

## 2023-03-25 ENCOUNTER — Other Ambulatory Visit (HOSPITAL_COMMUNITY): Payer: Self-pay

## 2023-04-10 ENCOUNTER — Other Ambulatory Visit (HOSPITAL_COMMUNITY): Payer: Self-pay

## 2023-04-10 MED ORDER — MINOXIDIL 2.5 MG PO TABS
2.5000 mg | ORAL_TABLET | Freq: Every day | ORAL | 3 refills | Status: DC
  Filled 2023-04-10: qty 90, 90d supply, fill #0
  Filled 2023-07-20: qty 90, 90d supply, fill #1
  Filled 2023-10-08: qty 90, 90d supply, fill #2
  Filled 2023-12-31: qty 90, 90d supply, fill #3

## 2023-04-10 MED ORDER — FINASTERIDE 5 MG PO TABS
5.0000 mg | ORAL_TABLET | Freq: Every day | ORAL | 3 refills | Status: DC
  Filled 2023-04-10: qty 90, 90d supply, fill #0
  Filled 2023-07-20: qty 90, 90d supply, fill #1
  Filled 2023-10-08: qty 90, 90d supply, fill #2
  Filled 2023-12-31: qty 90, 90d supply, fill #3

## 2023-04-13 ENCOUNTER — Other Ambulatory Visit (HOSPITAL_COMMUNITY): Payer: Self-pay

## 2023-04-14 ENCOUNTER — Encounter: Payer: Self-pay | Admitting: Internal Medicine

## 2023-04-14 ENCOUNTER — Ambulatory Visit (AMBULATORY_SURGERY_CENTER): Payer: 59 | Admitting: Internal Medicine

## 2023-04-14 VITALS — BP 101/63 | HR 69 | Temp 98.2°F | Resp 13 | Ht 68.0 in | Wt 128.0 lb

## 2023-04-14 DIAGNOSIS — Z1211 Encounter for screening for malignant neoplasm of colon: Secondary | ICD-10-CM | POA: Diagnosis not present

## 2023-04-14 DIAGNOSIS — N1831 Chronic kidney disease, stage 3a: Secondary | ICD-10-CM | POA: Diagnosis not present

## 2023-04-14 DIAGNOSIS — I10 Essential (primary) hypertension: Secondary | ICD-10-CM | POA: Diagnosis not present

## 2023-04-14 MED ORDER — SODIUM CHLORIDE 0.9 % IV SOLN
500.0000 mL | Freq: Once | INTRAVENOUS | Status: DC
Start: 2023-04-14 — End: 2023-04-14

## 2023-04-14 NOTE — Patient Instructions (Signed)
Repeat colonoscopy in 10 years for screening purposes.  Resume previous diet.  Continue present medications.  YOU HAD AN ENDOSCOPIC PROCEDURE TODAY AT THE [facility]:   Refer to the procedure report that was given to you for any specific questions about what was found during the examination.  If the procedure report does not answer your questions, please call your gastroenterologist to clarify.  If you requested that your care partner not be given the details of your procedure findings, then the procedure report has been included in a sealed envelope for you to review at your convenience later.  YOU SHOULD EXPECT: Some feelings of bloating in the abdomen. Passage of more gas than usual.  Walking can help get rid of the air that was put into your GI tract during the procedure and reduce the bloating. If you had a lower endoscopy (such as a colonoscopy or flexible sigmoidoscopy) you may notice spotting of blood in your stool or on the toilet paper. If you underwent a bowel prep for your procedure, you may not have a normal bowel movement for a few days.  Please Note:  You might notice some irritation and congestion in your nose or some drainage.  This is from the oxygen used during your procedure.  There is no need for concern and it should clear up in a day or so.  SYMPTOMS TO REPORT IMMEDIATELY:  Following lower endoscopy (colonoscopy or flexible sigmoidoscopy):  Excessive amounts of blood in the stool  Significant tenderness or worsening of abdominal pains  Swelling of the abdomen that is new, acute  Fever of 100F or higher  For urgent or emergent issues, a gastroenterologist can be reached at any hour by calling [phone]. Do not use MyChart messaging for urgent concerns.    DIET:  We do recommend a small meal at first, but then you may proceed to your regular diet.  Drink plenty of fluids but you should avoid alcoholic beverages for 24 hours.  ACTIVITY:  You should  plan to take it easy for the rest of today and you should NOT DRIVE or use heavy machinery until tomorrow (because of the sedation medicines used during the test).    FOLLOW UP: Our staff will call the number listed on your records the next business day following your procedure.  We will call around 7:15- 8:00 am to check on you and address any questions or concerns that you may have regarding the information given to you following your procedure. If we do not reach you, we will leave a message.     If any biopsies were taken you will be contacted by phone or by letter within the next 1-3 weeks.  Please call us at [phone] if you have not heard about the biopsies in 3 weeks.    SIGNATURES/CONFIDENTIALITY: You and/or your care partner have signed paperwork which will be entered into your electronic medical record.  These signatures attest to the fact that that the information above on your After Visit Summary has been reviewed and is understood.  Full responsibility of the confidentiality of this discharge information lies with you and/or your care-partner.  

## 2023-04-14 NOTE — Progress Notes (Signed)
HISTORY OF PRESENT ILLNESS:  Natalie David is a 62 y.o. female with history of adenoma. Now for colonoscopy   REVIEW OF SYSTEMS:  All non-GI ROS negative except for  Past Medical History:  Diagnosis Date   Chronic kidney disease    3A   Osteoporosis    Tachycardia     Past Surgical History:  Procedure Laterality Date   AUGMENTATION MAMMAPLASTY     BLEPHAROPLASTY  2011   BREAST ENHANCEMENT SURGERY  2010   COLONOSCOPY     LAPAROSCOPY     for endometriosis   LAPAROTOMY     Rt ectopic   LYMPH NODE BIOPSY     Rt underarm   NASAL SEPTUM SURGERY      Social History Natalie David  reports that she has never smoked. She has never used smokeless tobacco. She reports current alcohol use of about 3.0 standard drinks of alcohol per week. She reports that she does not use drugs.  family history includes Aortic aneurysm in her father and mother; COPD in her mother; Cirrhosis in her father; Heart disease in her father; Hyperlipidemia in her daughter and sister; Hypertension in her father and sister; Kidney disease in her mother; Thyroid disease in her father and another family member.  Allergies  Allergen Reactions   Codeine Nausea Only    Other Reaction(s): GI Intolerance       PHYSICAL EXAMINATION: Vital signs: BP 119/63   Pulse 69   Temp 98.2 F (36.8 C)   Resp 11   Ht 5\' 8"  (1.727 m)   Wt 128 lb (58.1 kg)   LMP 12/16/2009   SpO2 100%   BMI 19.46 kg/m  General: Well-developed, well-nourished, no acute distress HEENT: Sclerae are anicteric, conjunctiva pink. Oral mucosa intact Lungs: Clear Heart: Regular Abdomen: soft, nontender, nondistended, no obvious ascites, no peritoneal signs, normal bowel sounds. No organomegaly. Extremities: No edema Psychiatric: alert and oriented x3. Cooperative     ASSESSMENT:  Hx adenomas   PLAN:  Colonoscopy

## 2023-04-14 NOTE — Progress Notes (Signed)
Pt's states no medical or surgical changes since previsit or office visit. 

## 2023-04-14 NOTE — Progress Notes (Signed)
Report to PACU, RN, vss, BBS= Clear.  

## 2023-04-14 NOTE — Op Note (Signed)
[facility] Patient Name: Natalie David Procedure Date: 04/14/2023 11:51 AM MRN: [id number] Endoscopist: Wilhemina Bonito. Marina Goodell , MD, [id number] Age: 62 Referring MD:  Date of Birth: 26-Dec-1960 Gender: Female Account #: [id number] Procedure:                Colonoscopy Indications:              Screening for colorectal malignant neoplasm. prior                            exams 2003 (TA); 2009; 2014 Medicines:                Monitored Anesthesia Care Procedure:                Pre-Anesthesia Assessment:                           - Prior to the procedure, a History and Physical                            was performed, and patient medications and                            allergies were reviewed. The patient's tolerance of                            previous anesthesia was also reviewed. The risks                            and benefits of the procedure and the sedation                            options and risks were discussed with the patient.                            All questions were answered, and informed consent                            was obtained. Prior Anticoagulants: The patient has                            taken no anticoagulant or antiplatelet agents. ASA                            Grade Assessment: II - A patient with mild systemic                            disease. After reviewing the risks and benefits,                            the patient was deemed in satisfactory condition to                            undergo the procedure.  After obtaining informed consent, the colonoscope                            was passed under direct vision. Throughout the                            procedure, the patient's blood pressure, pulse, and                            oxygen saturations were monitored continuously. The                            Olympus Scope SN: [id number] was introduced through                            the anus and advanced to the  the cecum, identified                            by appendiceal orifice and ileocecal valve. The                            ileocecal valve, appendiceal orifice, and rectum                            were photographed. The quality of the bowel                            preparation was excellent. The colonoscopy was                            performed without difficulty. The patient tolerated                            the procedure well. The bowel preparation used was                            SUPREP via split dose instruction. Scope In: 12:15:08 PM Scope Out: 12:32:23 PM Scope Withdrawal Time: 0 hours 10 minutes 4 seconds  Total Procedure Duration: 0 hours 17 minutes 15 seconds  Findings:                 The terminal ileum appeared normal.                           The entire examined colon appeared normal on direct                            and retroflexion views. Complications:            No immediate complications. Estimated blood loss:                            None. Estimated Blood Loss:     Estimated blood loss: none. Impression:               - The  examined portion of the ileum was normal.                           - The entire examined colon is normal on direct and                            retroflexion views.                           - No specimens collected. Recommendation:           - Repeat colonoscopy in 10 years for screening                            purposes.                           - Patient has a contact number available for                            emergencies. The signs and symptoms of potential                            delayed complications were discussed with the                            patient. Return to normal activities tomorrow.                            Written discharge instructions were provided to the                            patient.                           - Resume previous diet.                           - Continue present  medications. Wilhemina Bonito. Marina Goodell, MD 04/14/2023 12:37:19 PM This report has been signed electronically.

## 2023-04-15 ENCOUNTER — Telehealth: Payer: Self-pay

## 2023-04-15 NOTE — Telephone Encounter (Signed)
Left message on follow up call. 

## 2023-04-21 ENCOUNTER — Other Ambulatory Visit: Payer: Self-pay | Admitting: Family Medicine

## 2023-04-21 DIAGNOSIS — Z1231 Encounter for screening mammogram for malignant neoplasm of breast: Secondary | ICD-10-CM

## 2023-04-28 ENCOUNTER — Ambulatory Visit (INDEPENDENT_AMBULATORY_CARE_PROVIDER_SITE_OTHER): Payer: 59 | Admitting: Family Medicine

## 2023-04-28 ENCOUNTER — Encounter: Payer: Self-pay | Admitting: Family Medicine

## 2023-04-28 ENCOUNTER — Other Ambulatory Visit (HOSPITAL_COMMUNITY): Payer: Self-pay

## 2023-04-28 ENCOUNTER — Other Ambulatory Visit (HOSPITAL_COMMUNITY)
Admission: RE | Admit: 2023-04-28 | Discharge: 2023-04-28 | Disposition: A | Discharge status: Home / Self Care | Payer: 59 | Source: Ambulatory Visit | Attending: Family Medicine | Admitting: Family Medicine

## 2023-04-28 VITALS — BP 111/68 | HR 82 | Ht 68.0 in | Wt 125.0 lb

## 2023-04-28 DIAGNOSIS — N1831 Chronic kidney disease, stage 3a: Secondary | ICD-10-CM

## 2023-04-28 DIAGNOSIS — Z124 Encounter for screening for malignant neoplasm of cervix: Secondary | ICD-10-CM | POA: Diagnosis not present

## 2023-04-28 DIAGNOSIS — M8580 Other specified disorders of bone density and structure, unspecified site: Secondary | ICD-10-CM

## 2023-04-28 DIAGNOSIS — Z0000 Encounter for general adult medical examination without abnormal findings: Secondary | ICD-10-CM

## 2023-04-28 DIAGNOSIS — R000 Tachycardia, unspecified: Secondary | ICD-10-CM | POA: Diagnosis not present

## 2023-04-28 MED ORDER — METOPROLOL SUCCINATE ER 25 MG PO TB24
25.0000 mg | ORAL_TABLET | Freq: Every day | ORAL | 3 refills | Status: DC
Start: 2023-04-28 — End: 2024-04-19
  Filled 2023-04-28: qty 90, 90d supply, fill #0
  Filled 2023-07-20: qty 90, 90d supply, fill #1
  Filled 2023-10-08: qty 90, 90d supply, fill #2
  Filled 2023-12-31: qty 90, 90d supply, fill #3

## 2023-04-28 NOTE — Addendum Note (Signed)
Addended by: Deno Etienne on: 04/28/2023 12:41 PM   Modules accepted: Orders

## 2023-04-28 NOTE — Progress Notes (Addendum)
Established Patient Office Visit  Subjective   Patient ID: Natalie David, female    DOB: [birth year]  Age: 62 y.o. MRN: [id number]  Chief Complaint  Patient presents with   Gynecologic Exam    HPI  Here for CPE:  She stays active and walks 50 minutes 4 days a week.  She takes care of her 32-month-old granddaughter Charlie 3 to 4 days a week for 10 hours.  Here for cervical cancer screening -some issues with vaginal dryness but no other abnormal discharge or pelvic pain.  F/U CKD 3 - no recent changes.   Past Medical History:  Diagnosis Date   Chronic kidney disease    3A   Osteoporosis    Tachycardia    Past Surgical History:  Procedure Laterality Date   AUGMENTATION MAMMAPLASTY     BLEPHAROPLASTY  2011   BREAST ENHANCEMENT SURGERY  2010   COLONOSCOPY     LAPAROSCOPY     for endometriosis   LAPAROTOMY     Rt ectopic   LYMPH NODE BIOPSY     Rt underarm   NASAL SEPTUM SURGERY     Social Connections: Socially Integrated (02/20/2023)   Social Connection and Isolation Panel [NHANES]    Frequency of Communication with Friends and Family: More than three times a week    Frequency of Social Gatherings with Friends and Family: More than three times a week    Attends Religious Services: More than 4 times per year    Active Member of [employer] or Organizations: Yes    Attends Engineer, structural: More than 4 times per year    Marital Status: Married     ROS    Objective:     BP 111/68   Pulse 82   Ht 5\' 8"  (1.727 m)   Wt 125 lb (56.7 kg)   LMP 12/16/2009   SpO2 100%   BMI 19.01 kg/m    Physical Exam Vitals and nursing note reviewed.  Constitutional:      Appearance: She is well-developed.  HENT:     Head: Normocephalic and atraumatic.  Cardiovascular:     Rate and Rhythm: Normal rate and regular rhythm.     Heart sounds: Normal heart sounds.  Pulmonary:     Effort: Pulmonary effort is normal.     Breath sounds: Normal breath sounds.   Genitourinary:    General: Normal vulva.     Labia:        Right: No rash.        Left: Rash present.      Vagina: Normal.     Cervix: Normal.     Uterus: Normal.      Adnexa: Right adnexa normal and left adnexa normal.  Skin:    General: Skin is warm and dry.  Neurological:     Mental Status: She is alert and oriented to person, place, and time.  Psychiatric:        Behavior: Behavior normal.      No results found for any visits on 04/28/23.    The 10-year ASCVD risk score (Arnett DK, et al., 2019) is: 2.9%    Assessment & Plan:   Problem List Items Addressed This Visit       Musculoskeletal and Integument   Osteopenia   Relevant Orders   COMPLETE METABOLIC PANEL WITH GFR   Urine Microalbumin w/creat. 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 ratio   PTH, Intact and Calcium   Lipid Panel w/reflex Direct LDL   CBC     Other   Tachycardia   Relevant Medications   metoprolol succinate (TOPROL-XL) 25 MG 24 hr tablet   Other Visit Diagnoses     Wellness examination    -  Primary   Cervical cancer screening          Mammogram has already been scheduled for August 22. Just had a colonoscopy completed July 2. Pap smear performed today we will call with results once available. Vaccinations up-to-date will plan to get pneumonia vaccine at 64.  Return for Wellness Exam.   I spent 25 minutes on the day of the encounter to include pre-visit record review, face-to-face time with the patient and post visit ordering of test.   Nani Gasser, MD

## 2023-04-28 NOTE — Addendum Note (Signed)
Addended by: Nani Gasser D on: 04/28/2023 09:07 AM   Modules accepted: Level of Service

## 2023-04-29 LAB — COMPLETE METABOLIC PANEL WITH GFR
AG Ratio: 1.7 (calc) (ref 1.0–2.5)
ALT: 14 U/L (ref 6–29)
AST: 23 U/L (ref 10–35)
Albumin: 4.8 g/dL (ref 3.6–5.1)
Alkaline phosphatase (APISO): 68 U/L (ref 37–153)
BUN: 13 mg/dL (ref 7–25)
CO2: 30 mmol/L (ref 20–32)
Calcium: 9.8 mg/dL (ref 8.6–10.4)
Chloride: 103 mmol/L (ref 98–110)
Creat: 1.04 mg/dL (ref 0.50–1.05)
Globulin: 2.8 g/dL (calc) (ref 1.9–3.7)
Glucose, Bld: 99 mg/dL (ref 65–99)
Potassium: 4.3 mmol/L (ref 3.5–5.3)
Sodium: 139 mmol/L (ref 135–146)
Total Bilirubin: 0.8 mg/dL (ref 0.2–1.2)
Total Protein: 7.6 g/dL (ref 6.1–8.1)
eGFR: 60 mL/min/{1.73_m2} (ref >=60)

## 2023-04-29 LAB — CBC
HCT: 35.1 % (ref 35.0–45.0)
Hemoglobin: 11.9 g/dL (ref 11.7–15.5)
MCH: 32.9 pg (ref 27.0–33.0)
MCHC: 33.9 g/dL (ref 32.0–36.0)
MCV: 97 fL (ref 80.0–100.0)
MPV: 10.5 fL (ref 7.5–12.5)
Platelets: 148 Thousand/uL (ref 140–400)
RBC: 3.62 Million/uL — ABNORMAL LOW (ref 3.80–5.10)
RDW: 11.6 % (ref 11.0–15.0)
WBC: 4.7 Thousand/uL (ref 3.8–10.8)

## 2023-04-29 LAB — PTH, INTACT AND CALCIUM
Calcium: 9.8 mg/dL (ref 8.6–10.4)
PTH: 23 pg/mL (ref 16–77)

## 2023-04-29 LAB — LIPID PANEL W/REFLEX DIRECT LDL
Cholesterol: 205 mg/dL — ABNORMAL HIGH (ref <200)
HDL: 82 mg/dL (ref >=50)
LDL Cholesterol (Calc): 105 mg/dL (calc) — ABNORMAL HIGH
Non-HDL Cholesterol (Calc): 123 mg/dL (calc) (ref <130)
Total CHOL/HDL Ratio: 2.5 (calc) (ref <5.0)
Triglycerides: 90 mg/dL (ref <150)

## 2023-04-29 LAB — MICROALBUMIN / CREATININE URINE RATIO
Creatinine, Urine: 38 mg/dL (ref 20–275)
Microalb Creat Ratio: 5 mg/g creat (ref <30)
Microalb, Ur: 0.2 mg/dL

## 2023-04-29 NOTE — Progress Notes (Signed)
Hi Natalie David, LDL is up just slightly above 100 right around 105, but pretty stable for you over the last 5 years.  Hemoglobin looks a little better this year at 11.9.  Metabolic panel looks great.  No excess protein in the urine which is great.  I did go ahead and check parathyroid hormone levels just to make sure that those are adequate and it looks great.

## 2023-04-30 LAB — CYTOLOGY - PAP
Comment: NEGATIVE
Diagnosis: NEGATIVE
High risk HPV: NEGATIVE

## 2023-04-30 NOTE — Progress Notes (Signed)
Pap is normal and negative for HPV>

## 2023-06-02 NOTE — Progress Notes (Signed)
[facility] - Clinic Note  06/16/2023   CHIEF COMPLAINT Patient presents for Retina Evaluation  HISTORY OF PRESENT ILLNESS: Natalie David is a 62 y.o. female who presents to the clinic today for:  HPI     Retina Evaluation   In both eyes.  Associated Symptoms Negative for Flashes, Floaters, Distortion, Blind Spot, Pain, Redness, Photophobia, Glare, Trauma, Scalp Tenderness, Jaw Claudication, Shoulder/Hip pain, Fever, Weight Loss and Fatigue.  I, the attending physician,  performed the HPI with the patient and updated documentation appropriately.        Comments   Pt here for retina follow up for hx of tear OD and ERM OD, pt last saw Dr. Luciana Axe one year ago, but he no longer takes her insurance, pt has not had any problems with her vision in the last year, does not use any drops, does not see any fol or floaters, pt saw Dr. Neale Burly in January and he saw a floater, pt had eyelid sx with Dr. Odis Luster      Last edited by Posey Boyer, COT on 06/16/2023  9:02 AM.    Pt is a previous pt of Dr. Luciana Axe, pt saw him in July 2022 for retinal tear OD, pt had laser retinopexy OD for operculated tear, pt also sees Dr. Neale Burly in [place] for contact lenses, pt wears monovision CL, OD for distance and OS for near, pt last saw Dr. Luciana Axe in September 2023 and was going to be released from his care, but he saw an ERM that day and wanted to keep following her  Referring physician: Raynald Blend, OD [address] [place],  [place] [address]  HISTORICAL INFORMATION:  Selected notes from the MEDICAL RECORD NUMBER Previous Dr. Luciana Axe pt here due to insurance reasons -- hx of retinal tear OD LEE:  Ocular Hx- PMH-   CURRENT MEDICATIONS: No current outpatient medications on file. (Ophthalmic Drugs)   No current facility-administered medications for this visit. (Ophthalmic Drugs)   Current Outpatient Medications (Other)  Medication Sig   acetaminophen (TYLENOL 8 HOUR  ARTHRITIS PAIN) 650 MG CR tablet Take 650 mg by mouth every 8 (eight) hours as needed.   bimatoprost (LATISSE) 0.03 % ophthalmic solution PLACE 1 DROP ON APPLICATOR/Q-TIP AND APPLY TO EYEBROWS NIGHTLY   Calcium Carb-Cholecalciferol 600-800 MG-UNIT TABS Take 1 tablet by mouth 2 (two) times daily.   finasteride (PROSCAR) 5 MG tablet Take 1 tablet (5 mg total) by mouth daily.   metoprolol succinate (TOPROL-XL) 25 MG 24 hr tablet Take 1 tablet (25 mg total) by mouth daily.   minoxidil (LONITEN) 2.5 MG tablet Take 1 tablet (2.5 mg total) by mouth daily.   Multiple Vitamins-Minerals (WOMENS MULTIVITAMIN PLUS PO) Take 1 capsule by mouth daily.   Omega-3 Fatty Acids (FISH OIL) 1000 MG CAPS Take by mouth 2 (two) times daily.   No current facility-administered medications for this visit. (Other)   REVIEW OF SYSTEMS: ROS   Positive for: Eyes Last edited by Rennis Chris, MD on 06/16/2023  8:51 AM.     ALLERGIES Allergies  Allergen Reactions   Codeine Nausea Only    Other Reaction(s): GI Intolerance   PAST MEDICAL HISTORY Past Medical History:  Diagnosis Date   Chronic kidney disease    3A   Osteoporosis    Tachycardia    Past Surgical History:  Procedure Laterality Date   AUGMENTATION MAMMAPLASTY     BLEPHAROPLASTY  2011   BREAST ENHANCEMENT SURGERY  2010  COLONOSCOPY     LAPAROSCOPY     for endometriosis   LAPAROTOMY     Rt ectopic   LYMPH NODE BIOPSY     Rt underarm   NASAL SEPTUM SURGERY     FAMILY HISTORY Family History  Problem Relation Age of Onset   Heart disease Father    Hypertension Father    Thyroid disease Father    Aortic aneurysm Father    Cirrhosis Father        Medication side effect.    Hypertension Sister    Thyroid disease Other    Hyperlipidemia Daughter    Hyperlipidemia Sister    Kidney disease Mother    Aortic aneurysm Mother    COPD Mother        worked in Ambulance person cancer Neg Hx    Esophageal cancer Neg Hx    Rectal cancer Neg Hx     Stomach cancer Neg Hx    Breast cancer Neg Hx    SOCIAL HISTORY Social History   Tobacco Use   Smoking status: Never   Smokeless tobacco: Never  Vaping Use   Vaping status: Never Used  Substance Use Topics   Alcohol use: Yes    Alcohol/week: 3.0 standard drinks of alcohol    Types: 3 Glasses of wine per week   Drug use: No       OPHTHALMIC EXAM:  Base Eye Exam     Visual Acuity (Snellen - Linear)       Right Left   Dist cc 20/20    [place]  20/30    Correction: Contacts         Tonometry (Tonopen, 8:44 AM)       Right Left   Pressure 21 22         Visual Fields (Counting fingers)       Left Right    Full Full         Extraocular Movement       Right Left    Full, Ortho Full, Ortho         Neuro/Psych     Mood/Affect: Normal         Dilation     Both eyes: 1.0% Mydriacyl, 2.5% Phenylephrine @ 8:44 AM           Slit Lamp and Fundus Exam     Slit Lamp Exam       Right Left   Lids/Lashes Dermatochalasis - upper lid Dermatochalasis - upper lid   Conjunctiva/Sclera White and quiet White and quiet   Cornea Trace tear film debris Trace tear film debris   Anterior Chamber deep, clear, narrow angles deep, clear, narrow angles   Iris Round and dilated Round and dilated   Lens 2+ Nuclear sclerosis, 2+ Cortical cataract 2+ Nuclear sclerosis, 2+ Cortical cataract   Anterior Vitreous mild syneresis, Posterior vitreous detachment mild syneresis, Posterior vitreous detachment         Fundus Exam       Right Left   Disc Pink and Sharp, +SVP, mild PPA Pink and Sharp, mild tilt, inferior PPA   C/D Ratio 0.2 0.2   Macula Flat, Good foveal reflex, RPE mottling, No heme or edema Flat, Good foveal reflex, RPE mottling, No heme or edema   Vessels mild attenuation, mild tortuosity mild attenuation, mild tortuosity   Periphery Attached, operculated hole at 1030 with good laser surrounding Attached  Refraction     Wearing Rx        Sphere   Right -1.25   Left -3.25  Rx per pt          IMAGING AND PROCEDURES  Imaging and Procedures for 06/16/2023  [place] LIMITED ULTRASOUND INCLUDING AXILLA RIGHT BREAST       CLINICAL DATA:  Recall from screening for a possible right breast asymmetry.  EXAM: DIGITAL DIAGNOSTIC UNILATERAL RIGHT MAMMOGRAM WITH TOMOSYNTHESIS AND CAD; ULTRASOUND RIGHT BREAST LIMITED  TECHNIQUE: Right digital diagnostic mammography and breast tomosynthesis was performed. The images were evaluated with computer-aided detection. ; Targeted ultrasound examination of the right breast was performed  COMPARISON:  Previous exam(s).  ACR Breast Density Category c: The breasts are heterogeneously dense, which may obscure small masses.  FINDINGS: On the diagnostic spot-compression images, the possible asymmetry noted on the current right breast screening implant displacement cc view disperses consistent with superimposed fibroglandular tissue. There is no underlying mass or significant residual asymmetry.  Targeted ultrasound is performed, showing normal fibroglandular tissue throughout the retroareolar right breast. No mass or suspicious finding.  IMPRESSION: 1. No evidence of breast malignancy.  RECOMMENDATION: Screening mammogram in one year.(Code:SM-B-01Y)  I have discussed the findings and recommendations with the patient. If applicable, a reminder letter will be sent to the patient regarding the next appointment.  BI-RADS CATEGORY  1: Negative.   Electronically Signed   By: Amie Portland M.D.   On: 06/16/2023 14:43      MM 3D DIAGNOSTIC MAMMOGRAM UNILATERAL RIGHT BREAST       CLINICAL DATA:  Recall from screening for a possible right breast asymmetry.  EXAM: DIGITAL DIAGNOSTIC UNILATERAL RIGHT MAMMOGRAM WITH TOMOSYNTHESIS AND CAD; ULTRASOUND RIGHT BREAST LIMITED  TECHNIQUE: Right digital diagnostic mammography and breast tomosynthesis was performed. The images  were evaluated with computer-aided detection. ; Targeted ultrasound examination of the right breast was performed  COMPARISON:  Previous exam(s).  ACR Breast Density Category c: The breasts are heterogeneously dense, which may obscure small masses.  FINDINGS: On the diagnostic spot-compression images, the possible asymmetry noted on the current right breast screening implant displacement cc view disperses consistent with superimposed fibroglandular tissue. There is no underlying mass or significant residual asymmetry.  Targeted ultrasound is performed, showing normal fibroglandular tissue throughout the retroareolar right breast. No mass or suspicious finding.  IMPRESSION: 1. No evidence of breast malignancy.  RECOMMENDATION: Screening mammogram in one year.(Code:SM-B-01Y)  I have discussed the findings and recommendations with the patient. If applicable, a reminder letter will be sent to the patient regarding the next appointment.  BI-RADS CATEGORY  1: Negative.   Electronically Signed   By: Amie Portland M.D.   On: 06/16/2023 14:43      OCT, Retina - OU - Both Eyes       Right Eye Quality was good. Scan locations included subfoveal. Central Foveal Thickness: 283. Progression has been stable. Findings include normal foveal contour, no IRF, no SRF (Trace ERM).   Left Eye Quality was good. Scan locations included subfoveal. Central Foveal Thickness: 283. Progression has no prior data. Findings include normal foveal contour, no IRF, no SRF.   Notes *Images captured and stored on drive  Diagnosis / Impression:  NFP, no IRF/SRF OU  Clinical management:  See below  Abbreviations: NFP - Normal foveal profile. CME - cystoid macular edema. PED - pigment epithelial detachment. IRF - intraretinal fluid. SRF - subretinal fluid. EZ - ellipsoid zone. ERM - epiretinal membrane. ORA -  outer retinal atrophy. ORT - outer retinal tubulation. SRHM - subretinal hyper-reflective  material. IRHM - intraretinal hyper-reflective material           ASSESSMENT/PLAN:   ICD-10-CM   1. History of retinal tear  Z86.69 OCT, Retina - OU - Both Eyes    2. Posterior vitreous detachment of both eyes  H43.813 OCT, Retina - OU - Both Eyes    3. Combined forms of age-related cataract of both eyes  H25.813      Hx of operculated tear OD  - initially had retina teal with VH OD in July 2022  - s/p laser retinopexy OD (Dr. Luciana Axe -- 07.12.22) -- good laser in place  - no new RT/RD or lattice  - pt is cleared from a retina standpoint for release to Dr. Neale Burly and resumption of primary eye care  2. PVD / vitreous syneresis OU  - Discussed findings and prognosis  - No RT or RD on 360 peripheral exam  - Reviewed s/s of RT/RD  - Strict return precautions for any such RT/RD signs/symptoms  3.Mixed Cataract OU - The symptoms of cataract, surgical options, and treatments and risks were discussed with patient. - discussed diagnosis and progression - monitor  Ophthalmic Meds Ordered this visit:  No orders of the defined types were placed in this encounter.    Return if symptoms worsen or fail to improve.  There are no Patient Instructions on file for this visit.  Explained the diagnoses, plan, and follow up with the patient and they expressed understanding.  Patient expressed understanding of the importance of proper follow up care.   This document serves as a record of services personally performed by Karie Chimera, MD, PhD. It was created on their behalf by Laurey Morale, COT an ophthalmic technician. The creation of this record is the provider's dictation and/or activities during the visit.    Electronically signed by:  Charlette Caffey, COT  06/16/23 6:35 PM   Karie Chimera, M.D., Ph.D. Diseases & Surgery of the Retina and Vitreous [facility] 06/16/2023  I have reviewed the above documentation for accuracy and completeness, and I  agree with the above. Karie Chimera, M.D., Ph.D. 06/16/23 6:37 PM   Abbreviations: M myopia (nearsighted); A astigmatism; H hyperopia (farsighted); P presbyopia; Mrx spectacle prescription;  CTL contact lenses; OD right eye; OS left eye; OU both eyes  XT exotropia; ET esotropia; PEK punctate epithelial keratitis; PEE punctate epithelial erosions; DES dry eye syndrome; MGD meibomian gland dysfunction; ATs artificial tears; PFAT's preservative free artificial tears; NSC nuclear sclerotic cataract; PSC posterior subcapsular cataract; ERM epi-retinal membrane; PVD posterior vitreous detachment; RD retinal detachment; DM diabetes mellitus; DR diabetic retinopathy; NPDR non-proliferative diabetic retinopathy; PDR proliferative diabetic retinopathy; CSME clinically significant macular edema; DME diabetic macular edema; dbh dot blot hemorrhages; CWS cotton wool spot; POAG primary open angle glaucoma; C/D cup-to-disc ratio; HVF humphrey visual field; GVF goldmann visual field; OCT optical coherence tomography; IOP intraocular pressure; BRVO Branch retinal vein occlusion; CRVO central retinal vein occlusion; CRAO central retinal artery occlusion; BRAO branch retinal artery occlusion; RT retinal tear; SB scleral buckle; PPV pars plana vitrectomy; VH Vitreous hemorrhage; PRP panretinal laser photocoagulation; IVK intravitreal kenalog; VMT vitreomacular traction; MH Macular hole;  NVD neovascularization of the disc; NVE neovascularization elsewhere; AREDS age related eye disease study; ARMD age related macular degeneration; POAG primary open angle glaucoma; EBMD epithelial/anterior basement membrane dystrophy; ACIOL anterior chamber intraocular lens; IOL intraocular lens;  PCIOL posterior chamber intraocular lens; Phaco/IOL phacoemulsification with intraocular lens placement; PRK photorefractive keratectomy; LASIK laser assisted in situ keratomileusis; HTN hypertension; DM diabetes mellitus; COPD chronic obstructive  pulmonary disease

## 2023-06-04 ENCOUNTER — Ambulatory Visit
Admission: RE | Admit: 2023-06-04 | Discharge: 2023-06-04 | Disposition: A | Discharge status: Home / Self Care | Payer: 59 | Source: Ambulatory Visit | Attending: Family Medicine | Admitting: Family Medicine

## 2023-06-04 DIAGNOSIS — Z1231 Encounter for screening mammogram for malignant neoplasm of breast: Secondary | ICD-10-CM

## 2023-06-05 NOTE — Progress Notes (Signed)
Natalie David, they saw a little asymmetry in the breast tissue so the imaging department will call you back to do some additional scans.  No calcifications so that is reassuring.

## 2023-06-08 ENCOUNTER — Other Ambulatory Visit: Payer: Self-pay | Admitting: Family Medicine

## 2023-06-08 DIAGNOSIS — R928 Other abnormal and inconclusive findings on diagnostic imaging of breast: Secondary | ICD-10-CM

## 2023-06-16 ENCOUNTER — Ambulatory Visit (INDEPENDENT_AMBULATORY_CARE_PROVIDER_SITE_OTHER): Payer: 59 | Admitting: Ophthalmology

## 2023-06-16 ENCOUNTER — Ambulatory Visit
Admission: RE | Admit: 2023-06-16 | Discharge: 2023-06-16 | Disposition: A | Discharge status: Home / Self Care | Payer: 59 | Source: Ambulatory Visit | Attending: Family Medicine | Admitting: Family Medicine

## 2023-06-16 ENCOUNTER — Encounter (INDEPENDENT_AMBULATORY_CARE_PROVIDER_SITE_OTHER): Payer: Self-pay | Admitting: Ophthalmology

## 2023-06-16 DIAGNOSIS — H4311 Vitreous hemorrhage, right eye: Secondary | ICD-10-CM

## 2023-06-16 DIAGNOSIS — Z8669 Personal history of other diseases of the nervous system and sense organs: Secondary | ICD-10-CM

## 2023-06-16 DIAGNOSIS — H43813 Vitreous degeneration, bilateral: Secondary | ICD-10-CM | POA: Diagnosis not present

## 2023-06-16 DIAGNOSIS — R928 Other abnormal and inconclusive findings on diagnostic imaging of breast: Secondary | ICD-10-CM

## 2023-06-16 DIAGNOSIS — H25813 Combined forms of age-related cataract, bilateral: Secondary | ICD-10-CM | POA: Diagnosis not present

## 2023-06-16 DIAGNOSIS — H2513 Age-related nuclear cataract, bilateral: Secondary | ICD-10-CM

## 2023-06-16 DIAGNOSIS — H43811 Vitreous degeneration, right eye: Secondary | ICD-10-CM

## 2023-06-16 DIAGNOSIS — H33311 Horseshoe tear of retina without detachment, right eye: Secondary | ICD-10-CM

## 2023-06-17 ENCOUNTER — Telehealth: Payer: Self-pay | Admitting: Family Medicine

## 2023-06-17 DIAGNOSIS — M8580 Other specified disorders of bone density and structure, unspecified site: Secondary | ICD-10-CM

## 2023-06-17 NOTE — Telephone Encounter (Signed)
Patient sent a Mychart message stating she is due for her bone density test and would like a referral to [facility]". Please advise.

## 2023-06-18 ENCOUNTER — Other Ambulatory Visit (HOSPITAL_BASED_OUTPATIENT_CLINIC_OR_DEPARTMENT_OTHER): Payer: Self-pay

## 2023-06-18 MED ORDER — FLULAVAL 0.5 ML IM SUSY
0.5000 mL | PREFILLED_SYRINGE | Freq: Once | INTRAMUSCULAR | 0 refills | Status: AC
  Filled 2023-06-18: qty 0.5, 1d supply, fill #0

## 2023-06-22 ENCOUNTER — Encounter (INDEPENDENT_AMBULATORY_CARE_PROVIDER_SITE_OTHER): Payer: No Typology Code available for payment source | Admitting: Ophthalmology

## 2023-06-26 NOTE — Telephone Encounter (Signed)
Order for bone density placed for [facility]

## 2023-07-06 ENCOUNTER — Other Ambulatory Visit (HOSPITAL_BASED_OUTPATIENT_CLINIC_OR_DEPARTMENT_OTHER): Payer: Self-pay

## 2023-07-06 MED ORDER — COVID-19 MRNA VAC-TRIS(PFIZER) 30 MCG/0.3ML IM SUSY
0.3000 mL | PREFILLED_SYRINGE | Freq: Once | INTRAMUSCULAR | 0 refills | Status: AC
  Filled 2023-07-06: qty 0.3, 1d supply, fill #0

## 2023-07-08 ENCOUNTER — Ambulatory Visit: Payer: 59 | Admitting: Family Medicine

## 2023-07-15 ENCOUNTER — Encounter: Payer: Self-pay | Admitting: Family Medicine

## 2023-07-15 ENCOUNTER — Ambulatory Visit: Payer: 59

## 2023-07-15 DIAGNOSIS — M8580 Other specified disorders of bone density and structure, unspecified site: Secondary | ICD-10-CM | POA: Diagnosis not present

## 2023-07-15 DIAGNOSIS — M810 Age-related osteoporosis without current pathological fracture: Secondary | ICD-10-CM

## 2023-07-15 NOTE — Progress Notes (Signed)
HI Natalie David, your DEXA shows T score of -3.1.  this is consistent with osteoporosis.     The current recommendation for osteoporosis treatment includes:   #1 calcium-total of 1200 mg of calcium daily.  If you eat a very calcium rich diet you may be able to obtain that without a supplement.  If not, then I recommend calcium 500 mg twice a day.  There are several products over-the-counter such as Caltrate D and Viactiv chews which are great options that contain calcium and vitamin D. #2 vitamin D-recommend 800 international units daily. #3 exercise-recommend 30 minutes of weightbearing exercise 3 days a week.  Resistance training ,such as doing bands and light weights, can be particularly helpful. #4 medication-if you are not currently on a bone builder, also called a bisphosphonate, then this has been shown to be very helpful in maintaining bone strength, preventing further thinning of the bones, and reducing your risk for fractures.  I would highly recommend that you consider starting 1 of these medications.  If you are okay with that then please let us know and we will send one to your pharmacy.  If you would like to discuss further we are happy to make an appointment for you so that we can go over options for treatment.

## 2023-07-16 ENCOUNTER — Other Ambulatory Visit (HOSPITAL_COMMUNITY): Payer: Self-pay

## 2023-07-16 ENCOUNTER — Other Ambulatory Visit: Payer: Self-pay | Admitting: Family Medicine

## 2023-07-16 DIAGNOSIS — M810 Age-related osteoporosis without current pathological fracture: Secondary | ICD-10-CM

## 2023-07-16 MED ORDER — RISEDRONATE SODIUM 35 MG PO TABS
35.0000 mg | ORAL_TABLET | ORAL | 4 refills | Status: DC
Start: 2023-07-16 — End: 2023-07-20
  Filled 2023-07-16 – 2023-07-17 (×2): qty 12, 84d supply, fill #0

## 2023-07-16 NOTE — Telephone Encounter (Signed)
Meds ordered this encounter  Medications   risedronate (ACTONEL) 35 MG tablet    Sig: Take 1 tablet (35 mg total) by mouth every 7 (seven) days. with water on empty stomach, nothing by mouth or lie down for next 30 minutes.    Dispense:  12 tablet    Refill:  4   '

## 2023-07-17 ENCOUNTER — Other Ambulatory Visit (HOSPITAL_COMMUNITY): Payer: Self-pay

## 2023-07-20 ENCOUNTER — Other Ambulatory Visit (HOSPITAL_COMMUNITY): Payer: Self-pay

## 2023-07-20 MED ORDER — ALENDRONATE SODIUM 70 MG PO TABS
70.0000 mg | ORAL_TABLET | ORAL | 4 refills | Status: DC
Start: 2023-07-20 — End: 2024-05-18
  Filled 2023-07-20: qty 12, 84d supply, fill #0
  Filled 2023-10-08: qty 12, 84d supply, fill #1
  Filled 2023-12-31: qty 12, 84d supply, fill #2
  Filled 2024-03-24: qty 12, 84d supply, fill #3

## 2023-07-20 NOTE — Addendum Note (Signed)
Addended by: Nani Gasser D on: 07/20/2023 05:11 PM   Modules accepted: Orders

## 2023-07-21 ENCOUNTER — Other Ambulatory Visit (HOSPITAL_COMMUNITY): Payer: Self-pay

## 2023-08-10 DIAGNOSIS — D225 Melanocytic nevi of trunk: Secondary | ICD-10-CM | POA: Diagnosis not present

## 2023-08-10 DIAGNOSIS — Z1283 Encounter for screening for malignant neoplasm of skin: Secondary | ICD-10-CM | POA: Diagnosis not present

## 2023-09-01 ENCOUNTER — Other Ambulatory Visit (HOSPITAL_COMMUNITY): Payer: Self-pay

## 2023-10-09 ENCOUNTER — Other Ambulatory Visit (HOSPITAL_COMMUNITY): Payer: Self-pay

## 2023-11-03 DIAGNOSIS — H5203 Hypermetropia, bilateral: Secondary | ICD-10-CM | POA: Diagnosis not present

## 2023-11-03 DIAGNOSIS — H524 Presbyopia: Secondary | ICD-10-CM | POA: Diagnosis not present

## 2023-12-31 ENCOUNTER — Other Ambulatory Visit (HOSPITAL_COMMUNITY): Payer: Self-pay

## 2024-04-19 ENCOUNTER — Encounter: Payer: Self-pay | Admitting: Family Medicine

## 2024-04-19 ENCOUNTER — Other Ambulatory Visit (HOSPITAL_COMMUNITY): Payer: Self-pay

## 2024-04-19 DIAGNOSIS — R000 Tachycardia, unspecified: Secondary | ICD-10-CM

## 2024-04-19 MED ORDER — METOPROLOL SUCCINATE ER 25 MG PO TB24
25.0000 mg | ORAL_TABLET | Freq: Every day | ORAL | 0 refills | Status: DC
  Filled 2024-04-19 – 2024-04-21 (×2): qty 90, 90d supply, fill #0

## 2024-04-21 ENCOUNTER — Other Ambulatory Visit (HOSPITAL_COMMUNITY): Payer: Self-pay

## 2024-04-21 ENCOUNTER — Other Ambulatory Visit: Payer: Self-pay

## 2024-04-21 DIAGNOSIS — L738 Other specified follicular disorders: Secondary | ICD-10-CM | POA: Diagnosis not present

## 2024-04-21 DIAGNOSIS — L658 Other specified nonscarring hair loss: Secondary | ICD-10-CM | POA: Diagnosis not present

## 2024-04-21 MED ORDER — FINASTERIDE 5 MG PO TABS
5.0000 mg | ORAL_TABLET | Freq: Every day | ORAL | 3 refills | Status: AC
  Filled 2024-04-21: qty 90, 90d supply, fill #0
  Filled 2024-07-18: qty 90, 90d supply, fill #1
  Filled 2024-10-13: qty 90, 90d supply, fill #2

## 2024-04-21 MED ORDER — MINOXIDIL 2.5 MG PO TABS
2.5000 mg | ORAL_TABLET | Freq: Every day | ORAL | 3 refills | Status: AC
  Filled 2024-04-21: qty 90, 90d supply, fill #0
  Filled 2024-07-18: qty 90, 90d supply, fill #1
  Filled 2024-10-13: qty 90, 90d supply, fill #2

## 2024-04-25 ENCOUNTER — Other Ambulatory Visit: Payer: Self-pay | Admitting: Family Medicine

## 2024-04-25 DIAGNOSIS — Z1231 Encounter for screening mammogram for malignant neoplasm of breast: Secondary | ICD-10-CM

## 2024-05-03 ENCOUNTER — Ambulatory Visit
Admission: EM | Admit: 2024-05-03 | Discharge: 2024-05-03 | Disposition: A | Discharge status: Home / Self Care | Attending: Family Medicine | Admitting: Family Medicine

## 2024-05-03 ENCOUNTER — Ambulatory Visit: Payer: Self-pay

## 2024-05-03 DIAGNOSIS — N3001 Acute cystitis with hematuria: Secondary | ICD-10-CM | POA: Diagnosis not present

## 2024-05-03 DIAGNOSIS — R350 Frequency of micturition: Secondary | ICD-10-CM | POA: Diagnosis present

## 2024-05-03 DIAGNOSIS — R300 Dysuria: Secondary | ICD-10-CM | POA: Diagnosis not present

## 2024-05-03 DIAGNOSIS — M810 Age-related osteoporosis without current pathological fracture: Secondary | ICD-10-CM | POA: Insufficient documentation

## 2024-05-03 DIAGNOSIS — R319 Hematuria, unspecified: Secondary | ICD-10-CM | POA: Diagnosis present

## 2024-05-03 DIAGNOSIS — N1831 Chronic kidney disease, stage 3a: Secondary | ICD-10-CM | POA: Diagnosis not present

## 2024-05-03 LAB — POCT URINALYSIS DIP (MANUAL ENTRY)
Bilirubin, UA: NEGATIVE
Glucose, UA: NEGATIVE mg/dL
Ketones, POC UA: NEGATIVE mg/dL
Nitrite, UA: NEGATIVE
Protein Ur, POC: >=300 mg/dL — AB
Spec Grav, UA: 1.01 (ref 1.010–1.025)
Urobilinogen, UA: 0.2 U/dL
pH, UA: 5.5 (ref 5.0–8.0)

## 2024-05-03 MED ORDER — CEFDINIR 300 MG PO CAPS: 300.0000 mg | ORAL_CAPSULE | Freq: Two times a day (BID) | ORAL | 0 refills | Status: DC

## 2024-05-03 NOTE — Discharge Instructions (Addendum)
 Advised patient to take medication as directed with food to completion.  Encouraged increase daily water intake to 64 ounces per day while taking this medication.  Advised we will follow-up with urine culture results once received.  Advised if symptoms worsen and/or unresolved please follow-up with your PCP or here for further evaluation.

## 2024-05-03 NOTE — Telephone Encounter (Signed)
 Patient called back to report symptoms have intensified and patient doesn't feel comfortable waiting to be seen tomorrow. Patient recommended to Urgent Care which patient agreed with. Patient reports she will be going today.

## 2024-05-03 NOTE — ED Provider Notes (Signed)
 [facility]    CSN: [id number] Arrival date & time: 05/03/24  1324      History   Chief Complaint Chief Complaint  Patient presents with   Hematuria    Blood in urine, urinary frequency and urinary urgency    HPI Natalie David is a 63 y.o. female.   HPI 63 year old female presents with blood in her urine, urinary frequency and urinary urgency for 1 day.  PMH significant for chronic kidney disease, osteoporosis, and  Past Medical History:  Diagnosis Date   Chronic kidney disease    3A   Osteoporosis    Tachycardia     Patient Active Problem List   Diagnosis Date Noted   Osteoporosis 07/15/2023   Primary osteoarthritis of right knee 01/06/2023   Impingement syndrome, shoulder, left 01/06/2023   Posterior vitreous detachment of right eye 06/19/2022   Nuclear sclerotic cataract of both eyes 06/19/2022   Vitreous hemorrhage of right eye (HCC) 04/23/2021   Operculated retinal tear of right eye 04/23/2021   Post-menopausal 04/10/2021   Vaginal dryness, menopausal 04/10/2021   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   Osteopenia 12/25/2015   CKD stage G3a/A1, GFR 45-59 and albumin creatinine ratio <30 mg/g (HCC) 12/12/2012   Tachycardia 12/11/2011   Elevated MCV 12/11/2011    Past Surgical History:  Procedure Laterality Date   AUGMENTATION MAMMAPLASTY     BLEPHAROPLASTY  2011   BREAST ENHANCEMENT SURGERY  2010   COLONOSCOPY     LAPAROSCOPY     for endometriosis   LAPAROTOMY     Rt ectopic   LYMPH NODE BIOPSY     Rt underarm   NASAL SEPTUM SURGERY      OB History     Gravida  2   Para  1   Term  1   Preterm      AB  1   Living  1      SAB      IAB      Ectopic  1   Multiple      Live Births               Home Medications    Prior to Admission medications   Medication Sig Start Date End Date Taking? Authorizing Provider  acetaminophen  (TYLENOL  8 HOUR ARTHRITIS PAIN) 650 MG CR  tablet Take 650 mg by mouth every 8 (eight) hours as needed. 01/06/23  Yes [provider]  alendronate  (FOSAMAX ) 70 MG tablet Take 1 tablet (70 mg total) by mouth every 7 (seven) days. Take with a full glass of water on an empty stomach. 07/20/23  Yes Alvan Dorothyann BIRCH, MD  bimatoprost (LATISSE) 0.03 % ophthalmic solution PLACE 1 DROP ON APPLICATOR/Q-TIP AND APPLY TO EYEBROWS NIGHTLY 04/04/22  Yes [provider]  Calcium Carb-Cholecalciferol 600-800 MG-UNIT TABS Take 1 tablet by mouth 2 (two) times daily.   Yes [provider]  cefdinir  (OMNICEF ) 300 MG capsule Take 1 capsule (300 mg total) by mouth 2 (two) times daily for 7 days. 05/03/24 05/10/24 Yes Teddy Sharper, FNP  finasteride  (PROSCAR ) 5 MG tablet Take 1 tablet (5 mg total) by mouth daily. 04/21/24  Yes   metoprolol  succinate (TOPROL -XL) 25 MG 24 hr tablet Take 1 tablet (25 mg total) by mouth daily. 04/19/24  Yes Alvan Dorothyann BIRCH, MD  minoxidil  (LONITEN ) 2.5 MG tablet Take 1 tablet (2.5 mg total) by mouth daily. 04/10/23  Yes   Multiple Vitamins-Minerals (  WOMENS MULTIVITAMIN PLUS PO) Take 1 capsule by mouth daily.   Yes [provider]  Omega-3 Fatty Acids (FISH OIL) 1000 MG CAPS Take by mouth 2 (two) times daily.   Yes [provider]  finasteride  (PROSCAR ) 5 MG tablet Take 1 tablet (5 mg total) by mouth daily. 04/10/23     minoxidil  (LONITEN ) 2.5 MG tablet Take 1 tablet (2.5 mg total) by mouth daily. 04/21/24       Family History Family History  Problem Relation Age of Onset   Heart disease Father    Hypertension Father    Thyroid  disease Father    Aortic aneurysm Father    Cirrhosis Father        Medication side effect.    Hypertension Sister    Thyroid  disease Other    Hyperlipidemia Daughter    Hyperlipidemia Sister    Kidney disease Mother    Aortic aneurysm Mother    COPD Mother        worked in Ambulance person cancer Neg Hx    Esophageal cancer Neg Hx    Rectal cancer  Neg Hx    Stomach cancer Neg Hx    Breast cancer Neg Hx     Social History Social History   Tobacco Use   Smoking status: Never   Smokeless tobacco: Never  Vaping Use   Vaping status: Never Used  Substance Use Topics   Alcohol use: Yes    Alcohol/week: 3.0 standard drinks of alcohol    Types: 3 Glasses of wine per week   Drug use: No     Allergies   Codeine   Review of Systems Review of Systems  Genitourinary:  Positive for dysuria, frequency and urgency.  All other systems reviewed and are negative.    Physical Exam Triage Vital Signs ED Triage Vitals [05/03/24 1337]  Encounter Vitals Group     BP      Girls Systolic BP Percentile      Girls Diastolic BP Percentile      Boys Systolic BP Percentile      Boys Diastolic BP Percentile      Pulse      Resp      Temp      Temp src      SpO2      Weight 126 lb (57.2 kg)     Height 5' 8 (1.727 m)     Head Circumference      Peak Flow      Pain Score 4     Pain Loc      Pain Education      Exclude from Growth Chart    No data found.  Updated Vital Signs BP 104/66 (BP Location: Right Arm)   Pulse (!) 113   Temp 98 F (36.7 C) (Oral)   Resp 17   Ht 5' 8 (1.727 m)   Wt 126 lb (57.2 kg)   LMP 12/16/2009   SpO2 99%   BMI 19.16 kg/m    Physical Exam Vitals and nursing note reviewed.  Constitutional:      Appearance: Normal appearance. She is normal weight.  HENT:     Head: Normocephalic and atraumatic.     Mouth/Throat:     Mouth: Mucous membranes are moist.     Pharynx: Oropharynx is clear.  Eyes:     Extraocular Movements: Extraocular movements intact.     Conjunctiva/sclera: Conjunctivae normal.     Pupils: Pupils are equal,  round, and reactive to light.  Cardiovascular:     Rate and Rhythm: Normal rate and regular rhythm.     Pulses: Normal pulses.     Heart sounds: Normal heart sounds.  Pulmonary:     Effort: Pulmonary effort is normal.     Breath sounds: Normal breath sounds. No  wheezing, rhonchi or rales.  Abdominal:     Tenderness: There is no right CVA tenderness or left CVA tenderness.  Musculoskeletal:        General: Normal range of motion.  Skin:    General: Skin is warm and dry.  Neurological:     General: No focal deficit present.     Mental Status: She is alert and oriented to person, place, and time.  Psychiatric:        Mood and Affect: Mood normal.        Behavior: Behavior normal.       UC Treatments / Results  Labs (all labs ordered are listed, but only abnormal results are displayed) Labs Reviewed  POCT URINALYSIS DIP (MANUAL ENTRY) - Abnormal; Notable for the following components:      Result Value   Color, UA red (*)    Blood, UA large (*)    Protein Ur, POC >=300 (*)    Leukocytes, UA Small (1+) (*)    All other components within normal limits  URINE CULTURE    EKG   Radiology No results found.  Procedures Procedures (including critical care time)  Medications Ordered in UC Medications - No data to display  Initial Impression / Assessment and Plan / UC Course  I have reviewed the triage vital signs and the nursing notes.  Pertinent labs & imaging results that were available during my care of the patient were reviewed by me and considered in my medical decision making (see chart for details).     MDM: 1.  Acute cystitis with hematuria-UA revealed above, urine culture ordered, Rx'd cefdinir  300 mg capsule: Take 1 capsule twice daily x 7 days. Advised patient to take medication as directed with food to completion.  Encouraged increase daily water intake to 64 ounces per day while taking this medication.  Advised we will follow-up with urine culture results once received.  Advised if symptoms worsen and/or unresolved please follow-up with your PCP or here for further evaluation.  Patient discharged home, hemodynamically stable. Final Clinical Impressions(s) / UC Diagnoses   Final diagnoses:  Dysuria  Acute cystitis with  hematuria     Discharge Instructions      Advised patient to take medication as directed with food to completion.  Encouraged increase daily water intake to 64 ounces per day while taking this medication.  Advised we will follow-up with urine culture results once received.  Advised if symptoms worsen and/or unresolved please follow-up with your PCP or here for further evaluation.     ED Prescriptions     Medication Sig Dispense Auth. Provider   cefdinir  (OMNICEF ) 300 MG capsule Take 1 capsule (300 mg total) by mouth 2 (two) times daily for 7 days. 14 capsule Viral Schramm, FNP      PDMP not reviewed this encounter.   Teddy Sharper, FNP 05/03/24 1410

## 2024-05-03 NOTE — ED Triage Notes (Signed)
 Pt states that she has blood in her urine, urinary frequency and urinary urgency. X1 day

## 2024-05-03 NOTE — Telephone Encounter (Signed)
 FYI Only or Action Required?: FYI only for provider.  Patient was last seen in primary care on 04/28/2023 by Alvan Dorothyann BIRCH, MD.  Called Nurse Triage reporting Urinary Frequency.  Symptoms began today.  Interventions attempted: Nothing.  Symptoms are: unchanged.  Triage Disposition: Call PCP Within 24 Hours  Patient/caregiver understands and will follow disposition?: Yes  **Appt. Scheduled for 7/23**          Copied from CRM [id number]. Topic: Clinical - Red Word Triage >> May 03, 2024 12:47 PM Brittney F wrote: [facility] that prompted transfer to Nurse Triage:   Painful urination; peeing pink Reason for Disposition  [1] Painful urination AND [2] EITHER frequency or urgency AND [3] has on-call doctor  Answer Assessment - Initial Assessment Questions 1. SEVERITY: How bad is the pain?  (e.g., Scale 1-10; mild, moderate, or severe)     4/10  2. FREQUENCY: How many times have you had painful urination today?      Increased frequency   3. PATTERN: Is pain present every time you urinate or just sometimes?      Yes  4. ONSET: When did the painful urination start?      X 1 day  5. FEVER: Do you have a fever? If Yes, ask: What is your temperature, how was it measured, and when did it start?     No   6. PAST UTI: Have you had a urine infection before? If Yes, ask: When was the last time? and What happened that time?       Yes, a few years ago   7. CAUSE: What do you think is causing the painful urination?  (e.g., UTI, scratch, Herpes sore)     UTI  8. OTHER SYMPTOMS: Do you have any other symptoms? (e.g., blood in urine, flank pain, genital sores, urgency, vaginal discharge)     Pink Urine, urgency, mild flank pain.  Protocols used: Urination Pain - Female-A-AH

## 2024-05-04 ENCOUNTER — Ambulatory Visit: Admitting: Physician Assistant

## 2024-05-05 ENCOUNTER — Ambulatory Visit (HOSPITAL_COMMUNITY): Payer: Self-pay

## 2024-05-05 LAB — URINE CULTURE: Culture: >=100000 — AB

## 2024-05-10 ENCOUNTER — Encounter: Admitting: Family Medicine

## 2024-05-10 ENCOUNTER — Ambulatory Visit: Admitting: Family Medicine

## 2024-05-10 ENCOUNTER — Encounter: Payer: Self-pay | Admitting: Family Medicine

## 2024-05-10 ENCOUNTER — Ambulatory Visit: Payer: Self-pay

## 2024-05-10 ENCOUNTER — Other Ambulatory Visit (HOSPITAL_COMMUNITY): Payer: Self-pay

## 2024-05-10 ENCOUNTER — Other Ambulatory Visit (HOSPITAL_BASED_OUTPATIENT_CLINIC_OR_DEPARTMENT_OTHER): Payer: Self-pay

## 2024-05-10 VITALS — BP 133/70 | HR 77 | Temp 98.7°F | Ht 67.0 in | Wt 130.0 lb

## 2024-05-10 DIAGNOSIS — R300 Dysuria: Secondary | ICD-10-CM

## 2024-05-10 DIAGNOSIS — N760 Acute vaginitis: Secondary | ICD-10-CM | POA: Diagnosis not present

## 2024-05-10 DIAGNOSIS — R21 Rash and other nonspecific skin eruption: Secondary | ICD-10-CM

## 2024-05-10 LAB — POCT URINALYSIS DIP (CLINITEK)
Bilirubin, UA: NEGATIVE
Blood, UA: NEGATIVE
Glucose, UA: NEGATIVE mg/dL
Ketones, POC UA: NEGATIVE mg/dL
Leukocytes, UA: NEGATIVE
Nitrite, UA: NEGATIVE
POC PROTEIN,UA: NEGATIVE
Spec Grav, UA: 1.01 (ref 1.010–1.025)
Urobilinogen, UA: 0.2 U/dL
pH, UA: 7 (ref 5.0–8.0)

## 2024-05-10 MED ORDER — CLOTRIMAZOLE-BETAMETHASONE 1-0.05 % EX CREA
1.0000 | TOPICAL_CREAM | Freq: Two times a day (BID) | CUTANEOUS | 1 refills | Status: AC
  Filled 2024-05-10: qty 30, 10d supply, fill #0

## 2024-05-10 NOTE — Telephone Encounter (Signed)
 Appt today 05/10/2024 with patient's PCP at 3:20 PM   FYI Only or Action Required?: FYI only for provider.  Patient was last seen in primary care on 04/28/2023 by Alvan Dorothyann BIRCH, MD.  Called Nurse Triage reporting Urinary Symptoms.  Symptoms began over a week ago.  Interventions attempted: Prescription medications: Cefdinir  and Other: Went to Urgent Care.  Symptoms are: gradually worsening.  Triage Disposition: See Physician Within 24 Hours  Patient/caregiver understands and will follow disposition?: Yes                     Copied from CRM [id number]. Topic: Clinical - Red Word Triage >> May 10, 2024  1:21 PM Kevelyn M wrote: Patient went to urgent care last Tuesday, took antibiotics for 7 days and finished yesterday and is still experiencing burning not just with urination. Reason for Disposition  Age > 50 years  Answer Assessment - Initial Assessment Questions Patient went to Urgent Care a week ago Patient took antibiotic for a week and finished yesterday    1. SYMPTOM: What's the main symptom you're concerned about? (e.g., frequency, incontinence)     burning 2. ONSET: When did the  burning  start?     Still present for a week 3. PAIN: Is there any pain? If Yes, ask: How bad is it? (Scale: 1-10; mild, moderate, severe)     2 4. CAUSE: What do you think is causing the symptoms?     Unsure exactly 5. OTHER SYMPTOMS: Do you have any other symptoms? (e.g., blood in urine, fever, flank pain, pain with urination)     burning  Answer Assessment - Initial Assessment Questions 1. SEVERITY: How bad is the pain?  (e.g., Scale 1-10; mild, moderate, or severe)     2 2. FREQUENCY: How many times have you had painful urination today?      Burns even without urinating 3. PATTERN: Is pain present every time you urinate or just sometimes?      All the time 4. ONSET: When did the painful urination start?      A week ago 5. FEVER: Do you  have a fever? If Yes, ask: What is your temperature, how was it measured, and when did it start?     No 6. PAST UTI: Have you had a urine infection before? If Yes, ask: When was the last time? and What happened that time?      Yes 7. CAUSE: What do you think is causing the painful urination?  (e.g., UTI, scratch, Herpes sore)     Unsure exactly 8. OTHER SYMPTOMS: Do you have any other symptoms? (e.g., blood in urine, flank pain, genital sores, urgency, vaginal discharge)     Patient denies  Protocols used: Urinary Symptoms-A-AH, Urination Pain - Female-A-AH

## 2024-05-10 NOTE — Progress Notes (Signed)
 Acute Office Visit  Subjective:     Patient ID: Natalie David, female    DOB: [birth year], 63 y.o.   MRN: [id number]  Chief Complaint  Patient presents with   Dysuria    Pt states she still having  buring while urinating     HPI Patient is in today for vaginal burning sensation.  She says that she started having having some frequency urgency and some blood in the urine that occurred pretty quickly it was about a day and a half after intercourse.  She says she ended up going to urgent care on July 22.  They did send a culture showing E. coli greater than 100,000 colony-forming units.  She was treated with cefdinir  and completed her antibiotics yesterday.  She says the urgency and frequency and bloody urine has completely resolved but now she is just having some vaginal burning sensation she has not noticed a discrete rash.  She does occasionally get a little red area on the back of the sacrum.  She says she noticed it gets more irritated when she has been sitting in the floor with her grandchild.  She says even when she was using her rowing machine to exercise she would actually get a blister in that area so she is not sure how much of it is a true rash versus maybe some extra friction it does get really irritated at times.   ROS      Objective:    LMP 12/16/2009    Physical Exam Exam conducted with a chaperone present.  Genitourinary:    Pubic Area: No rash.      Labia:        Right: No rash.        Left: No rash.      Urethra: No urethral lesion.     Comments: Vaginal tissues are more dry. Skin:    Comments: At the sacral area she has a erythematous maculopapular rash with a little bit of scale.     Results for orders placed or performed in visit on 05/10/24  WET PREP FOR TRICH, YEAST, CLUE  Result Value Ref Range   Trichomonas Exam Negative Negative   Yeast Exam Negative Negative   Clue Cell Exam Negative Negative  POCT URINALYSIS DIP (CLINITEK)  Result  Value Ref Range   Color, UA yellow yellow   Clarity, UA clear clear   Glucose, UA negative negative mg/dL   Bilirubin, UA negative negative   Ketones, POC UA negative negative mg/dL   Spec Grav, UA 8.989 8.989 - 1.025   Blood, UA negative negative   pH, UA 7.0 5.0 - 8.0   POC PROTEIN,UA negative negative, trace   Urobilinogen, UA 0.2 0.2 or 1.0 E.U./dL   Nitrite, UA Negative Negative   Leukocytes, UA Negative Negative        Assessment & Plan:   Problem List Items Addressed This Visit   None Visit Diagnoses       Dysuria    -  Primary   Relevant Orders   POCT URINALYSIS DIP (CLINITEK) (Completed)   Urine Culture   WET PREP FOR TRICH, YEAST, CLUE (Completed)     Rash       Relevant Medications   clotrimazole -betamethasone  (LOTRISONE ) cream     Acute vaginitis           Vaginitis, acute-wet prep performed will call with results once available she does have some vaginal dryness I do recommend using a moisturizer such  as coconut oil or Replens.  We will go ahead and send urine sample for culture just to confirm that we have completely cleared up the infection and that is not causing some residual symptoms.  Rash on the sacrum-will do a trial treatment with Lotrisone  cream try to avoid direct pressure on the area as much as possible.  Meds ordered this encounter  Medications   clotrimazole -betamethasone  (LOTRISONE ) cream    Sig: Apply topically as directed 2 (two) times daily for 10 days.    Dispense:  30 g    Refill:  1    No follow-ups on file.  Dorothyann Byars, MD

## 2024-05-11 ENCOUNTER — Ambulatory Visit: Payer: Self-pay | Admitting: Family Medicine

## 2024-05-11 LAB — WET PREP FOR TRICH, YEAST, CLUE
Clue Cell Exam: NEGATIVE
Trichomonas Exam: NEGATIVE
Yeast Exam: NEGATIVE

## 2024-05-11 NOTE — Progress Notes (Signed)
 HI Joli your swab ws negative for yeast or bacteria which is great.  I did notice the tissues were dry on exam so I think that could be contributing.  Do you want to try a moisturizer for a few days and see if that helps while we wait for the urine culture.

## 2024-05-12 LAB — URINE CULTURE: Organism ID, Bacteria: NO GROWTH

## 2024-05-12 NOTE — Progress Notes (Signed)
 Urine culture is negative no sign of UTI.  Looks like that has cleared up.

## 2024-05-17 ENCOUNTER — Ambulatory Visit (INDEPENDENT_AMBULATORY_CARE_PROVIDER_SITE_OTHER): Admitting: Family Medicine

## 2024-05-17 ENCOUNTER — Encounter: Payer: Self-pay | Admitting: Family Medicine

## 2024-05-17 VITALS — BP 109/70 | HR 90 | Ht 68.0 in | Wt 125.8 lb

## 2024-05-17 DIAGNOSIS — Z0000 Encounter for general adult medical examination without abnormal findings: Secondary | ICD-10-CM

## 2024-05-17 DIAGNOSIS — Z23 Encounter for immunization: Secondary | ICD-10-CM | POA: Diagnosis not present

## 2024-05-17 DIAGNOSIS — M810 Age-related osteoporosis without current pathological fracture: Secondary | ICD-10-CM

## 2024-05-17 DIAGNOSIS — N1831 Chronic kidney disease, stage 3a: Secondary | ICD-10-CM

## 2024-05-17 NOTE — Assessment & Plan Note (Signed)
 Continue fosamax . Due for repeat DEXA next year.

## 2024-05-17 NOTE — Progress Notes (Signed)
 Complete physical exam  Patient: Natalie David   DOB: 01-04-1961   63 y.o. Female  MRN: [id number]  Subjective:    Chief Complaint  Patient presents with   Annual Exam    Natalie David is a 63 y.o. female who presents today for a complete physical exam. She reports consuming a general diet. She exercises.  She generally feels well. . She does not have additional problems to discuss today.   Mammo scheduled.     Most recent fall risk assessment:    05/17/2024    8:38 AM  Fall Risk   Falls in the past year? 1  Number falls in past yr: 0  Injury with Fall? 1  Risk for fall due to : No Fall Risks  Follow up Falls evaluation completed     Most recent depression screenings:    05/17/2024    8:38 AM 04/28/2023    8:53 AM  PHQ 2/9 Scores  PHQ - 2 Score 0 0         Patient Care Team: Alvan Dorothyann BIRCH, MD as PCP - General (Family Medicine)   Outpatient Medications Prior to Visit  Medication Sig   acetaminophen  (TYLENOL  8 HOUR ARTHRITIS PAIN) 650 MG CR tablet Take 650 mg by mouth every 8 (eight) hours as needed.   alendronate  (FOSAMAX ) 70 MG tablet Take 1 tablet (70 mg total) by mouth every 7 (seven) days. Take with a full glass of water on an empty stomach.   bimatoprost (LATISSE) 0.03 % ophthalmic solution PLACE 1 DROP ON APPLICATOR/Q-TIP AND APPLY TO EYEBROWS NIGHTLY   Calcium Carb-Cholecalciferol 600-800 MG-UNIT TABS Take 1 tablet by mouth 2 (two) times daily.   clotrimazole -betamethasone  (LOTRISONE ) cream Apply topically as directed 2 (two) times daily for 10 days.   finasteride  (PROSCAR ) 5 MG tablet Take 1 tablet (5 mg total) by mouth daily.   metoprolol  succinate (TOPROL -XL) 25 MG 24 hr tablet Take 1 tablet (25 mg total) by mouth daily.   minoxidil  (LONITEN ) 2.5 MG tablet Take 1 tablet (2.5 mg total) by mouth daily.   Multiple Vitamins-Minerals (WOMENS MULTIVITAMIN PLUS PO) Take 1 capsule by mouth daily.   Omega-3 Fatty Acids (FISH OIL) 1000 MG  CAPS Take by mouth 2 (two) times daily.   [DISCONTINUED] minoxidil  (LONITEN ) 2.5 MG tablet Take 1 tablet (2.5 mg total) by mouth daily.   [DISCONTINUED] finasteride  (PROSCAR ) 5 MG tablet Take 1 tablet (5 mg total) by mouth daily.   No facility-administered medications prior to visit.    ROS        Objective:     BP 109/70   Pulse 90   Ht 5' 8 (1.727 m)   Wt 125 lb 12.8 oz (57.1 kg)   LMP 12/16/2009   SpO2 100%   BMI 19.13 kg/m     Physical Exam Exam conducted with a chaperone present.  Constitutional:      Appearance: Normal appearance.  HENT:     Head: Normocephalic and atraumatic.     Right Ear: Tympanic membrane, ear canal and external ear normal.     Left Ear: Tympanic membrane, ear canal and external ear normal.     Nose: Nose normal.     Mouth/Throat:     Pharynx: Oropharynx is clear.  Eyes:     Extraocular Movements: Extraocular movements intact.     Conjunctiva/sclera: Conjunctivae normal.     Pupils: Pupils are equal, round, and reactive to light.  Neck:  Thyroid : No thyromegaly.  Cardiovascular:     Rate and Rhythm: Normal rate and regular rhythm.  Pulmonary:     Effort: Pulmonary effort is normal.     Breath sounds: Normal breath sounds.  Chest:     Chest wall: No mass.  Breasts:    Right: Normal. No mass, nipple discharge or skin change.     Left: Normal. No mass, nipple discharge or skin change.     Comments: Bilt implants.  Abdominal:     General: Bowel sounds are normal.     Palpations: Abdomen is soft.     Tenderness: There is no abdominal tenderness.  Musculoskeletal:        General: No swelling.     Cervical back: Neck supple.  Lymphadenopathy:     Upper Body:     Right upper body: No supraclavicular, axillary or pectoral adenopathy.     Left upper body: No supraclavicular, axillary or pectoral adenopathy.  Skin:    General: Skin is warm and dry.  Neurological:     Mental Status: She is oriented to person, place, and time.   Psychiatric:        Mood and Affect: Mood normal.        Behavior: Behavior normal.      No results found for any visits on 05/17/24.      Assessment & Plan:    Routine Health Maintenance and Physical Exam  Immunization History  Administered Date(s) Administered   Influenza, Seasonal, Injecte, Preservative Fre 06/18/2023   Influenza,inj,Quad PF,6+ Mos 07/11/2013, 07/04/2014, 07/14/2015, 07/10/2022   Influenza-Unspecified 07/07/2016, 07/06/2017, 07/02/2018, 07/08/2019, 07/13/2020, 07/05/2021   PFIZER(Purple Top)SARS-COV-2 Vaccination 09/27/2019, 10/18/2019, 07/27/2020   PNEUMOCOCCAL CONJUGATE-20 05/17/2024   Pfizer Covid-19 Vaccine Bivalent Booster 31yrs & up 07/29/2021   Pfizer(Comirnaty )Fall Seasonal Vaccine 12 years and older 08/11/2022, 07/06/2023   Tdap 02/27/2009, 03/16/2019   Zoster Recombinant(Shingrix ) 01/29/2017, 06/01/2017    Health Maintenance  Topic Date Due   INFLUENZA VACCINE  05/13/2024   MAMMOGRAM  06/03/2025   DEXA SCAN  07/14/2025   Cervical Cancer Screening (HPV/Pap Cotest)  04/27/2028   DTaP/Tdap/Td (3 - Td or Tdap) 03/15/2029   Colonoscopy  04/13/2033   Pneumococcal Vaccine: 19-49 Years  Completed   Pneumococcal Vaccine: 50+ Years  Completed   COVID-19 Vaccine  Completed   Hepatitis C Screening  Completed   HIV Screening  Completed   Zoster Vaccines- Shingrix   Completed   Hepatitis B Vaccines  Aged Out   HPV VACCINES  Aged Out   Meningococcal B Vaccine  Aged Out    Discussed health benefits of physical activity, and encouraged her to engage in regular exercise appropriate for her age and condition.  Problem List Items Addressed This Visit       Musculoskeletal and Integument   Osteoporosis   Continue fosamax . Due for repeat DEXA next year.         Genitourinary   CKD stage G3a/A1, GFR 45-59 and albumin creatinine ratio <30 mg/g (HCC)   Due for urine micro. No recent changes      Relevant Orders   CMP14+EGFR   Lipid panel   CBC    Urine Microalbumin w/creat. ratio   TSH   Hemoglobin A1c   Other Visit Diagnoses       Wellness examination    -  Primary   Relevant Orders   CMP14+EGFR   Lipid panel   CBC   Urine Microalbumin w/creat. ratio   TSH   Hemoglobin A1c  Encounter for immunization       Relevant Orders   Pneumococcal conjugate vaccine 20-valent (Completed)       Keep up a regular exercise program and make sure you are eating a healthy diet Try to eat 4 servings of dairy a day, or if you are lactose intolerant take a calcium with vitamin D  daily.  Your vaccines are up to date.   Return in about 1 year (around 05/17/2025) for Wellness Exam.     Dorothyann Byars, MD

## 2024-05-17 NOTE — Assessment & Plan Note (Signed)
 Due for urine micro. No recent changes

## 2024-05-18 ENCOUNTER — Other Ambulatory Visit (HOSPITAL_COMMUNITY): Payer: Self-pay

## 2024-05-18 ENCOUNTER — Ambulatory Visit: Payer: Self-pay | Admitting: Family Medicine

## 2024-05-18 ENCOUNTER — Other Ambulatory Visit (HOSPITAL_BASED_OUTPATIENT_CLINIC_OR_DEPARTMENT_OTHER): Payer: Self-pay

## 2024-05-18 ENCOUNTER — Encounter: Payer: Self-pay | Admitting: Family Medicine

## 2024-05-18 DIAGNOSIS — R000 Tachycardia, unspecified: Secondary | ICD-10-CM

## 2024-05-18 DIAGNOSIS — M810 Age-related osteoporosis without current pathological fracture: Secondary | ICD-10-CM

## 2024-05-18 LAB — LIPID PANEL
Chol/HDL Ratio: 2.8 ratio (ref 0.0–4.4)
Cholesterol, Total: 223 mg/dL — ABNORMAL HIGH (ref 100–199)
HDL: 80 mg/dL (ref >39)
LDL Chol Calc (NIH): 127 mg/dL — ABNORMAL HIGH (ref 0–99)
Triglycerides: 94 mg/dL (ref 0–149)
VLDL Cholesterol Cal: 16 mg/dL (ref 5–40)

## 2024-05-18 LAB — CMP14+EGFR
ALT: 12 IU/L (ref 0–32)
AST: 25 IU/L (ref 0–40)
Albumin: 4.8 g/dL (ref 3.9–4.9)
Alkaline Phosphatase: 67 IU/L (ref 44–121)
BUN/Creatinine Ratio: 16 (ref 12–28)
BUN: 19 mg/dL (ref 8–27)
Bilirubin Total: 0.5 mg/dL (ref 0.0–1.2)
CO2: 23 mmol/L (ref 20–29)
Calcium: 9.7 mg/dL (ref 8.7–10.3)
Chloride: 100 mmol/L (ref 96–106)
Creatinine, Ser: 1.16 mg/dL — ABNORMAL HIGH (ref 0.57–1.00)
Globulin, Total: 3 g/dL (ref 1.5–4.5)
Glucose: 96 mg/dL (ref 70–99)
Potassium: 4.5 mmol/L (ref 3.5–5.2)
Sodium: 138 mmol/L (ref 134–144)
Total Protein: 7.8 g/dL (ref 6.0–8.5)
eGFR: 53 mL/min/1.73 — ABNORMAL LOW (ref >59)

## 2024-05-18 LAB — TSH: TSH: 1.13 u[IU]/mL (ref 0.450–4.500)

## 2024-05-18 LAB — MICROALBUMIN / CREATININE URINE RATIO
Creatinine, Urine: 71.1 mg/dL
Microalb/Creat Ratio: <4 mg/g{creat} (ref 0–29)
Microalbumin, Urine: <3 ug/mL

## 2024-05-18 LAB — CBC
Hematocrit: 36.6 % (ref 34.0–46.6)
Hemoglobin: 12 g/dL (ref 11.1–15.9)
MCH: 32.3 pg (ref 26.6–33.0)
MCHC: 32.8 g/dL (ref 31.5–35.7)
MCV: 98 fL — ABNORMAL HIGH (ref 79–97)
Platelets: 165 x10E3/uL (ref 150–450)
RBC: 3.72 x10E6/uL — ABNORMAL LOW (ref 3.77–5.28)
RDW: 11.9 % (ref 11.7–15.4)
WBC: 5.9 x10E3/uL (ref 3.4–10.8)

## 2024-05-18 LAB — HEMOGLOBIN A1C
Est. average glucose Bld gHb Est-mCnc: 103 mg/dL
Hgb A1c MFr Bld: 5.2 % (ref 4.8–5.6)

## 2024-05-18 MED ORDER — ALENDRONATE SODIUM 70 MG PO TABS
70.0000 mg | ORAL_TABLET | ORAL | 4 refills | Status: DC
  Filled 2024-05-18: qty 12, 84d supply, fill #0

## 2024-05-18 MED ORDER — ESTRADIOL 0.1 MG/GM VA CREA
1.0000 | TOPICAL_CREAM | Freq: Every day | VAGINAL | 12 refills | Status: AC
  Filled 2024-05-18: qty 42.5, 90d supply, fill #0

## 2024-05-18 MED ORDER — ALENDRONATE SODIUM 70 MG PO TABS
70.0000 mg | ORAL_TABLET | ORAL | 4 refills | Status: AC
  Filled 2024-05-18 – 2024-06-14 (×2): qty 12, 84d supply, fill #0
  Filled 2024-09-05: qty 12, 84d supply, fill #1

## 2024-05-18 MED ORDER — METOPROLOL SUCCINATE ER 25 MG PO TB24
25.0000 mg | ORAL_TABLET | Freq: Every day | ORAL | 3 refills | Status: DC
  Filled 2024-05-18: qty 90, 90d supply, fill #0

## 2024-05-18 MED ORDER — METOPROLOL SUCCINATE ER 25 MG PO TB24
25.0000 mg | ORAL_TABLET | Freq: Every day | ORAL | 3 refills | Status: AC
  Filled 2024-05-18 – 2024-07-18 (×2): qty 90, 90d supply, fill #0
  Filled 2024-10-13: qty 90, 90d supply, fill #1

## 2024-05-18 NOTE — Progress Notes (Signed)
 Hi Natalie David, kidney function is stable at 1.1.  Total cholesterol and LDL are elevated.  Continue to work on [employer].  It is probably somewhat genetic.  Your hemoglobin is stable.  No excess protein in the urine which is great.  Thyroid  level looks normal.  A1c in the normal range no sign of diabetes or prediabetes.

## 2024-05-28 ENCOUNTER — Telehealth: Admitting: Physician Assistant

## 2024-05-28 DIAGNOSIS — N390 Urinary tract infection, site not specified: Secondary | ICD-10-CM

## 2024-05-28 MED ORDER — SULFAMETHOXAZOLE-TRIMETHOPRIM 800-160 MG PO TABS: 1.0000 | ORAL_TABLET | Freq: Two times a day (BID) | ORAL | 0 refills | Status: AC

## 2024-05-28 NOTE — Patient Instructions (Signed)
 Natalie David, thank you for joining Natalie Velma Lunger, PA-C for today's virtual visit.  While this provider is not your primary care provider (PCP), if your PCP is located in our provider database this encounter information will be shared with them immediately following your visit.   A [facility] MyChart account gives you access to today's visit and all your visits, tests, and labs performed at [facility]  click here if you don't have a [facility] MyChart account or go to [email or web address]  Consent: (Patient) Natalie David provided verbal consent for this virtual visit at the beginning of the encounter.  Current Medications:  Current Outpatient Medications:    acetaminophen  (TYLENOL  8 HOUR ARTHRITIS PAIN) 650 MG CR tablet, Take 650 mg by mouth every 8 (eight) hours as needed., Disp: , Rfl:    alendronate  (FOSAMAX ) 70 MG tablet, Take 1 tablet (70 mg total) by mouth every 7 (seven) days. Take with a full glass of water on an empty stomach., Disp: 12 tablet, Rfl: 4   bimatoprost (LATISSE) 0.03 % ophthalmic solution, PLACE 1 DROP ON APPLICATOR/Q-TIP AND APPLY TO EYEBROWS NIGHTLY, Disp: , Rfl:    Calcium Carb-Cholecalciferol 600-800 MG-UNIT TABS, Take 1 tablet by mouth 2 (two) times daily., Disp: , Rfl:    clotrimazole -betamethasone  (LOTRISONE ) cream, Apply topically as directed 2 (two) times daily for 10 days., Disp: 30 g, Rfl: 1   estradiol  (ESTRACE ) 0.1 MG/GM vaginal cream, Place 1 Applicatorful vaginally at bedtime for 1 week, then 3 times per week, Disp: 42.5 g, Rfl: 12   finasteride  (PROSCAR ) 5 MG tablet, Take 1 tablet (5 mg total) by mouth daily., Disp: 90 tablet, Rfl: 3   metoprolol  succinate (TOPROL -XL) 25 MG 24 hr tablet, Take 1 tablet (25 mg total) by mouth daily., Disp: 90 tablet, Rfl: 3   minoxidil  (LONITEN ) 2.5 MG tablet, Take 1 tablet (2.5 mg total) by mouth daily., Disp: 90 tablet, Rfl: 3   Multiple Vitamins-Minerals (WOMENS MULTIVITAMIN  PLUS PO), Take 1 capsule by mouth daily., Disp: , Rfl:    Omega-3 Fatty Acids (FISH OIL) 1000 MG CAPS, Take by mouth 2 (two) times daily., Disp: , Rfl:    Medications ordered in this encounter:  No orders of the defined types were placed in this encounter.    *If you need refills on other medications prior to your next appointment, please contact your pharmacy*  Follow-Up: Call back or seek an in-person evaluation if the symptoms worsen or if the condition fails to improve as anticipated.  [facility] [phone]  Other Instructions Your symptoms are consistent with a bladder infection, also called acute cystitis. Please take your antibiotic (Bactrim ) as directed until all pills are gone.  Stay very well hydrated.  Consider a daily probiotic (Align, Culturelle, or Activia) to help prevent stomach upset caused by the antibiotic.  Taking a probiotic daily may also help prevent recurrent UTIs.  Also consider taking AZO (Phenazopyridine) tablets to help decrease pain with urination.   Make sure to follow-up with Dr. Alvan if you are continuing to have frequent urinary symptoms despite use of vaginal moisturizer.  She may want to talk to you about other preventive measures versus having you speak with the urologist.   Urinary Tract Infection A urinary tract infection (UTI) can occur any place along the urinary tract. The tract includes the kidneys, ureters, bladder, and urethra. A type of germ called bacteria often causes a UTI. UTIs are often helped with antibiotic medicine.  HOME CARE  If given, take antibiotics as told by your doctor. Finish them even if you start to feel better. Drink enough fluids to keep your pee (urine) clear or pale yellow. Avoid tea, drinks with caffeine, and bubbly (carbonated) drinks. Pee often. Avoid holding your pee in for a long time. Pee before and after having sex (intercourse). Wipe from front to back after you poop (bowel movement) if you  are a woman. Use each tissue only once. GET HELP RIGHT AWAY IF:  You have back pain. You have lower belly (abdominal) pain. You have chills. You feel sick to your stomach (nauseous). You throw up (vomit). Your burning or discomfort with peeing does not go away. You have a fever. Your symptoms are not better in 3 days. MAKE SURE YOU:  Understand these instructions. Will watch your condition. Will get help right away if you are not doing well or get worse. Document Released: 03/17/2008 Document Revised: 06/23/2012 Document Reviewed: 04/29/2012 [facility] Patient Information 2015 [place], [place]. This information is not intended to replace advice given to you by your health care provider. Make sure you discuss any questions you have with your health care provider.    If you have been instructed to have an in-person evaluation today at a local Urgent Care facility, please use the link below. It will take you to a list of all of our available [facility], including address, phone number and hours of operation. Please do not delay care.  [facility]  If you or a family member do not have a primary care provider, use the link below to schedule a visit and establish care. When you choose a [facility] primary care physician or advanced practice provider, you gain a long-term partner in health. Find a Primary Care Provider  Learn more about [facility]'s in-office and virtual care options: [facility] - Get Care Now

## 2024-05-28 NOTE — Progress Notes (Signed)
 Virtual Visit Consent   Brinly Maietta, you are scheduled for a virtual visit with a [facility] provider today. Just as with appointments in the office, your consent must be obtained to participate. Your consent will be active for this visit and any virtual visit you may have with one of our providers in the next 365 days. If you have a MyChart account, a copy of this consent can be sent to you electronically.  As this is a virtual visit, video technology does not allow for your provider to perform a traditional examination. This may limit your provider's ability to fully assess your condition. If your provider identifies any concerns that need to be evaluated in person or the need to arrange testing (such as labs, EKG, etc.), we will make arrangements to do so. Although advances in technology are sophisticated, we cannot ensure that it will always work on either your end or our end. If the connection with a video visit is poor, the visit may have to be switched to a telephone visit. With either a video or telephone visit, we are not always able to ensure that we have a secure connection.  By engaging in this virtual visit, you consent to the provision of healthcare and authorize for your insurance to be billed (if applicable) for the services provided during this visit. Depending on your insurance coverage, you may receive a charge related to this service.  I need to obtain your verbal consent now. Are you willing to proceed with your visit today? Norissa Talayla Doyel has provided verbal consent on 05/28/2024 for a virtual visit (video or telephone). [name], [place]  Date: 05/28/2024 10:39 AM   Virtual Visit via Video Note   I, [name], connected with  Altovise Wahler  ([id number], 04-26-1961) on 05/28/24 at 10:30 AM EDT by a video-enabled telemedicine application and verified that I am speaking with the correct person using two  identifiers.  Location: Patient: Virtual Visit Location Patient: Home Provider: Virtual Visit Location Provider: Home Office   I discussed the limitations of evaluation and management by telemedicine and the availability of in person appointments. The patient expressed understanding and agreed to proceed.    History of Present Illness: Erik Vieva Brummitt is a 63 y.o. who identifies as a female who was assigned female at birth, and is being seen today for concern for having a UTI.  Endorses symptoms starting earlier this morning with mild dysuria and urgency/frequency.  Also noting some pink-tinged urine.  Denies fever, chills, nausea, vomiting or belly/back pain.  Endorses trying to keep well-hydrated.  Has been on vacation in [place]  the past week, out in the sun.  Is currently traveling back to her home in [place].  Of note, patient having another UTI a month ago, diagnosed via urine culture at outside urgent care.  Was treated for pansensitive E. coli UTI with course of cefdinir .  Had follow-up appointment with her primary care provider at which time a repeat culture was obtained and negative for growth.  Patient notes she has been using a vaginal moisturizer at the direction of her PCP.  No change to any other products used.  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 Take with a full glass of water on an empty stomach., Disp: 12 tablet, Rfl: 4   bimatoprost (LATISSE) 0.03 % ophthalmic solution, PLACE 1 DROP ON APPLICATOR/Q-TIP AND APPLY TO EYEBROWS NIGHTLY, Disp: , Rfl:    Calcium Carb-Cholecalciferol 600-800 MG-UNIT TABS, Take 1 tablet by mouth 2 (two) times daily., Disp: , Rfl:    clotrimazole -betamethasone  (LOTRISONE ) cream, Apply topically as directed 2 (two) times daily for 10 days., Disp: 30 g, Rfl: 1   estradiol  (ESTRACE ) 0.1 MG/GM vaginal cream, Place 1 Applicatorful vaginally at bedtime for 1 week, then 3 times per week, Disp: 42.5 g, Rfl: 12   finasteride  (PROSCAR ) 5 MG tablet, Take 1 tablet (5 mg total) by mouth daily., Disp: 90 tablet, Rfl: 3   metoprolol  succinate (TOPROL -XL) 25 MG 24 hr tablet, Take 1 tablet (25 mg total) by mouth daily., Disp: 90 tablet, Rfl: 3   minoxidil  (LONITEN ) 2.5 MG tablet, Take 1 tablet (2.5 mg total) by mouth daily., Disp: 90 tablet, Rfl: 3   Multiple Vitamins-Minerals (WOMENS MULTIVITAMIN PLUS PO), Take 1 capsule by mouth daily., Disp: , Rfl:    Omega-3 Fatty Acids (FISH OIL) 1000 MG CAPS, Take by mouth 2 (two) times daily., Disp: , Rfl:   Observations/Objective: Patient is well-developed, well-nourished in no acute distress.  Resting comfortably  at home.  Head is normocephalic, atraumatic.  No labored breathing.  Speech is clear and coherent with  logical content.  Patient is alert and oriented at baseline.   Assessment and Plan: 1. Frequent UTI (Primary) - sulfamethoxazole -trimethoprim  (BACTRIM  DS) 800-160 MG tablet; Take 1 tablet by mouth 2 (two) times daily.  Dispense: 10 tablet; Refill: 0  Classic UTI symptoms with absence of alarm signs or symptoms.  Prior history of UTI. Will treat empirically with Bactrim  for suspected uncomplicated cystitis. Supportive measures and OTC medications reviewed. Strict in-person evaluation precautions discussed.  Recommend general follow-up with her primary care provider if she continues to get more frequent UTIs despite use of vaginal moisturizer.  She may need to be started on topical estrogens versus need for urology consultation.   Follow Up Instructions: I discussed the assessment and treatment plan with the patient. The patient was provided an opportunity to ask questions and all were answered. The patient agreed with the plan and demonstrated an understanding of the instructions.  A copy of instructions were sent to the patient via MyChart unless otherwise noted below.   The patient was advised to call back or seek an in-person evaluation if the symptoms worsen or if the  condition fails to improve as anticipated.    [name], PA-C

## 2024-06-14 ENCOUNTER — Encounter: Payer: Self-pay | Admitting: Sports Medicine

## 2024-06-14 ENCOUNTER — Ambulatory Visit
Admission: RE | Admit: 2024-06-14 | Discharge: 2024-06-14 | Disposition: A | Discharge status: Home / Self Care | Source: Ambulatory Visit | Attending: Family Medicine | Admitting: Family Medicine

## 2024-06-14 ENCOUNTER — Other Ambulatory Visit (HOSPITAL_BASED_OUTPATIENT_CLINIC_OR_DEPARTMENT_OTHER): Payer: Self-pay

## 2024-06-14 ENCOUNTER — Other Ambulatory Visit (HOSPITAL_COMMUNITY): Payer: Self-pay

## 2024-06-14 DIAGNOSIS — Z1231 Encounter for screening mammogram for malignant neoplasm of breast: Secondary | ICD-10-CM

## 2024-06-14 MED ORDER — FLUZONE 0.5 ML IM SUSY
0.5000 mL | PREFILLED_SYRINGE | Freq: Once | INTRAMUSCULAR | 0 refills | Status: AC
Start: 2024-06-14 — End: 2024-06-15
  Filled 2024-06-14: qty 0.5, 1d supply, fill #0

## 2024-06-17 ENCOUNTER — Ambulatory Visit: Payer: Self-pay | Admitting: Family Medicine

## 2024-06-17 NOTE — Progress Notes (Signed)
 Please call patient. Normal mammogram.  Repeat in 1 year.

## 2024-07-18 ENCOUNTER — Other Ambulatory Visit: Payer: Self-pay

## 2024-07-18 ENCOUNTER — Other Ambulatory Visit (HOSPITAL_COMMUNITY): Payer: Self-pay

## 2024-09-05 ENCOUNTER — Encounter: Payer: Self-pay | Admitting: Pharmacist

## 2024-09-05 ENCOUNTER — Other Ambulatory Visit: Payer: Self-pay

## 2024-09-05 ENCOUNTER — Other Ambulatory Visit (HOSPITAL_COMMUNITY): Payer: Self-pay

## 2024-09-06 ENCOUNTER — Other Ambulatory Visit (HOSPITAL_COMMUNITY): Payer: Self-pay

## 2024-10-14 ENCOUNTER — Other Ambulatory Visit: Payer: Self-pay

## 2024-10-14 ENCOUNTER — Other Ambulatory Visit (HOSPITAL_COMMUNITY): Payer: Self-pay
